# Patient Record
Sex: Female | Born: 1950
Health system: Southern US, Community
[De-identification: ages and names within clinical notes are randomized; demographics above are authoritative.]

## PROBLEM LIST (undated history)

## (undated) DIAGNOSIS — D649 Anemia, unspecified: Secondary | ICD-10-CM

## (undated) DIAGNOSIS — E785 Hyperlipidemia, unspecified: Secondary | ICD-10-CM

## (undated) DIAGNOSIS — M810 Age-related osteoporosis without current pathological fracture: Secondary | ICD-10-CM

## (undated) DIAGNOSIS — K219 Gastro-esophageal reflux disease without esophagitis: Secondary | ICD-10-CM

## (undated) DIAGNOSIS — F329 Major depressive disorder, single episode, unspecified: Secondary | ICD-10-CM

## (undated) DIAGNOSIS — J302 Other seasonal allergic rhinitis: Secondary | ICD-10-CM

## (undated) DIAGNOSIS — K222 Esophageal obstruction: Secondary | ICD-10-CM

## (undated) DIAGNOSIS — K449 Diaphragmatic hernia without obstruction or gangrene: Secondary | ICD-10-CM

## (undated) DIAGNOSIS — M1990 Unspecified osteoarthritis, unspecified site: Secondary | ICD-10-CM

## (undated) DIAGNOSIS — E079 Disorder of thyroid, unspecified: Secondary | ICD-10-CM

## (undated) DIAGNOSIS — K648 Other hemorrhoids: Secondary | ICD-10-CM

## (undated) DIAGNOSIS — F32A Depression, unspecified: Secondary | ICD-10-CM

## (undated) HISTORY — DX: Esophageal obstruction: K22.2

## (undated) HISTORY — DX: Disorder of thyroid, unspecified: E07.9

## (undated) HISTORY — PX: WISDOM TOOTH EXTRACTION: SHX21

## (undated) HISTORY — DX: Other seasonal allergic rhinitis: J30.2

## (undated) HISTORY — DX: Unspecified osteoarthritis, unspecified site: M19.90

## (undated) HISTORY — DX: Gastro-esophageal reflux disease without esophagitis: K21.9

## (undated) HISTORY — DX: Hyperlipidemia, unspecified: E78.5

## (undated) HISTORY — DX: Depression, unspecified: F32.A

## (undated) HISTORY — DX: Other hemorrhoids: K64.8

## (undated) HISTORY — DX: Diaphragmatic hernia without obstruction or gangrene: K44.9

## (undated) HISTORY — DX: Major depressive disorder, single episode, unspecified: F32.9

## (undated) HISTORY — PX: NO PAST SURGERIES: SHX2092

## (undated) HISTORY — DX: Anemia, unspecified: D64.9

## (undated) HISTORY — DX: Age-related osteoporosis without current pathological fracture: M81.0

---

## 1998-11-19 ENCOUNTER — Other Ambulatory Visit: Admission: RE | Admit: 1998-11-19 | Discharge: 1998-11-19 | Payer: Self-pay | Admitting: Obstetrics and Gynecology

## 1999-12-16 ENCOUNTER — Other Ambulatory Visit: Admission: RE | Admit: 1999-12-16 | Discharge: 1999-12-16 | Payer: Self-pay | Admitting: Obstetrics and Gynecology

## 2000-07-27 ENCOUNTER — Encounter: Payer: Self-pay | Admitting: Family Medicine

## 2000-07-27 ENCOUNTER — Ambulatory Visit (HOSPITAL_COMMUNITY): Admission: RE | Admit: 2000-07-27 | Discharge: 2000-07-27 | Payer: Self-pay | Admitting: Family Medicine

## 2000-12-22 ENCOUNTER — Other Ambulatory Visit: Admission: RE | Admit: 2000-12-22 | Discharge: 2000-12-22 | Payer: Self-pay | Admitting: Obstetrics and Gynecology

## 2001-12-28 ENCOUNTER — Other Ambulatory Visit: Admission: RE | Admit: 2001-12-28 | Discharge: 2001-12-28 | Payer: Self-pay | Admitting: Obstetrics and Gynecology

## 2002-02-18 ENCOUNTER — Encounter: Payer: Self-pay | Admitting: *Deleted

## 2002-02-18 ENCOUNTER — Ambulatory Visit (HOSPITAL_COMMUNITY): Admission: RE | Admit: 2002-02-18 | Discharge: 2002-02-18 | Payer: Self-pay | Admitting: *Deleted

## 2002-11-16 ENCOUNTER — Encounter: Admission: RE | Admit: 2002-11-16 | Discharge: 2003-02-14 | Payer: Self-pay

## 2003-01-02 ENCOUNTER — Other Ambulatory Visit: Admission: RE | Admit: 2003-01-02 | Discharge: 2003-01-02 | Payer: Self-pay | Admitting: Obstetrics and Gynecology

## 2009-01-30 ENCOUNTER — Other Ambulatory Visit: Admission: RE | Admit: 2009-01-30 | Discharge: 2009-01-30 | Payer: Self-pay | Admitting: Obstetrics and Gynecology

## 2009-11-15 ENCOUNTER — Ambulatory Visit (HOSPITAL_COMMUNITY): Admission: RE | Admit: 2009-11-15 | Discharge: 2009-11-15 | Payer: Self-pay | Admitting: Family Medicine

## 2010-06-16 HISTORY — PX: COLONOSCOPY: SHX174

## 2010-08-09 ENCOUNTER — Encounter (INDEPENDENT_AMBULATORY_CARE_PROVIDER_SITE_OTHER): Payer: Self-pay | Admitting: *Deleted

## 2010-08-13 NOTE — Letter (Signed)
Summary: Pre Visit Letter Revised  [facility]  [address] [place], [place] [address]   Phone: [phone]  Fax: [phone]        08/09/2010 MRN: [id number] Vanessa Edwards [address] [place], [place]  [address]             Procedure Date:  September 16, 2100   recall col - Dr Juanda Chance  Welcome to the Gastroenterology Division at [facility].    You are scheduled to see a nurse for your pre-procedure visit on September 03, 2010 at 1:30pm on the 3rd floor at [facility], [address]  We ask that you try to arrive at our office 15 minutes prior to your appointment time to allow for check-in.  Please take a minute to review the attached form.  If you answer "Yes" to one or more of the questions on the first page, we ask that you call the person listed at your earliest opportunity.  If you answer "No" to all of the questions, please complete the rest of the form and bring it to your appointment.    Your nurse visit will consist of discussing your medical and surgical history, your immediate family medical history, and your medications.   If you are unable to list all of your medications on the form, please bring the medication bottles to your appointment and we will list them.  We will need to be aware of both prescribed and over the counter drugs.  We will need to know exact dosage information as well.    Please be prepared to read and sign documents such as consent forms, a financial agreement, and acknowledgement forms.  If necessary, and with your consent, a friend or relative is welcome to sit-in on the nurse visit with you.  Please bring your insurance card so that we may make a copy of it.  If your insurance requires a referral to see a specialist, please bring your referral form from your primary care physician.  No co-pay is required for this nurse visit.     If you cannot keep your appointment, please call [phone] to cancel or reschedule prior to  your appointment date.  This allows [place] the opportunity to schedule an appointment for another patient in need of care.    Thank you for choosing [facility] for your medical needs.  We appreciate the opportunity to care for you.  Please visit [place] at our website  to learn more about our practice.  Sincerely, The Gastroenterology Division

## 2010-09-03 ENCOUNTER — Ambulatory Visit (AMBULATORY_SURGERY_CENTER): Payer: BC Managed Care – PPO | Admitting: *Deleted

## 2010-09-03 VITALS — Ht 62.0 in | Wt 152.7 lb

## 2010-09-03 DIAGNOSIS — Z1211 Encounter for screening for malignant neoplasm of colon: Secondary | ICD-10-CM

## 2010-09-03 MED ORDER — PEG-KCL-NACL-NASULF-NA ASC-C 100 G PO SOLR: 1.0000 | Freq: Once | ORAL | Status: AC

## 2010-09-03 NOTE — Patient Instructions (Signed)
Pick up RX for Moviprep within 5 days of today.  

## 2010-09-04 ENCOUNTER — Encounter: Payer: Self-pay | Admitting: Internal Medicine

## 2010-09-16 ENCOUNTER — Encounter: Payer: Self-pay | Admitting: Internal Medicine

## 2010-09-17 ENCOUNTER — Ambulatory Visit (AMBULATORY_SURGERY_CENTER): Payer: BC Managed Care – PPO | Admitting: Internal Medicine

## 2010-09-17 ENCOUNTER — Encounter: Payer: Self-pay | Admitting: Internal Medicine

## 2010-09-17 VITALS — BP 135/76 | HR 83 | Temp 97.2°F | Resp 19 | Ht 61.0 in | Wt 150.0 lb

## 2010-09-17 DIAGNOSIS — Z1211 Encounter for screening for malignant neoplasm of colon: Secondary | ICD-10-CM

## 2010-09-17 DIAGNOSIS — K648 Other hemorrhoids: Secondary | ICD-10-CM

## 2010-09-17 MED ORDER — HYDROCORTISONE ACETATE 25 MG RE SUPP: 25.0000 mg | Freq: Every evening | RECTAL | Status: AC | PRN

## 2010-09-17 NOTE — Patient Instructions (Signed)
Discharge instructions given with verbal understanding. Handout on hemorrhoids given.

## 2010-09-18 ENCOUNTER — Telehealth: Payer: Self-pay | Admitting: *Deleted

## 2010-09-18 NOTE — Telephone Encounter (Signed)

## 2010-12-30 ENCOUNTER — Other Ambulatory Visit (HOSPITAL_COMMUNITY): Payer: Self-pay | Admitting: Family Medicine

## 2010-12-30 DIAGNOSIS — Z1231 Encounter for screening mammogram for malignant neoplasm of breast: Secondary | ICD-10-CM

## 2011-01-07 ENCOUNTER — Ambulatory Visit (HOSPITAL_COMMUNITY): Payer: BC Managed Care – PPO

## 2011-01-15 ENCOUNTER — Ambulatory Visit (HOSPITAL_COMMUNITY): Payer: BC Managed Care – PPO

## 2011-01-17 ENCOUNTER — Ambulatory Visit (HOSPITAL_COMMUNITY)
Admission: RE | Admit: 2011-01-17 | Discharge: 2011-01-17 | Disposition: A | Discharge status: Home / Self Care | Payer: BC Managed Care – PPO | Source: Ambulatory Visit | Attending: Family Medicine | Admitting: Family Medicine

## 2011-01-17 DIAGNOSIS — Z1231 Encounter for screening mammogram for malignant neoplasm of breast: Secondary | ICD-10-CM

## 2011-02-12 ENCOUNTER — Telehealth: Payer: Self-pay | Admitting: Internal Medicine

## 2011-02-12 NOTE — Telephone Encounter (Signed)
Patient has had colonoscopies with Dr. Juanda Chance. She now has bad indigestion and heart burn at night. She saw her PCP and was put on Pepcid AC in AM. This is not helping, she is having difficultly sleeping because of the reflux, heart burn. Patient wants to schedule an EGD. Should she been seen in OV or direct? Please, advise.

## 2011-02-12 NOTE — Telephone Encounter (Signed)
I know her well.She may try OTC Prilosec 20 mg po bid x 3 weeks, if no improvement then OK to schedule direct EGD

## 2011-02-13 NOTE — Telephone Encounter (Signed)
Spoke with patient and gave her Dr. Brodie's recommendations. 

## 2011-02-13 NOTE — Telephone Encounter (Signed)
Left a message for patient to call me. 

## 2011-03-13 ENCOUNTER — Telehealth: Payer: Self-pay | Admitting: Internal Medicine

## 2011-03-13 NOTE — Telephone Encounter (Signed)
Patient given recommendations by Dr. Juanda Chance. She is taking OTC Prilosec.

## 2011-03-13 NOTE — Telephone Encounter (Signed)
Continue bid dose x 1 month, then try to reduce to 1 po qd, if symptoms recur, stay on bid dose. Needs refills?

## 2011-03-13 NOTE — Telephone Encounter (Signed)
Patient is unavailable will call back in a few minutes.

## 2011-03-13 NOTE — Telephone Encounter (Signed)
Left message for patient to call me

## 2011-03-13 NOTE — Telephone Encounter (Signed)
Spoke with patient and she has been taking the Prilosec 20 mg x 3 weeks and she has felt better, no reflux. She missed one dose and felt bad. She wants to know if she should continue on BID and if so for how long. Please,advise.

## 2011-03-18 ENCOUNTER — Other Ambulatory Visit: Payer: Self-pay | Admitting: *Deleted

## 2011-03-18 MED ORDER — OMEPRAZOLE 20 MG PO CPDR: 20.0000 mg | DELAYED_RELEASE_CAPSULE | Freq: Two times a day (BID) | ORAL | Status: DC

## 2011-04-21 ENCOUNTER — Other Ambulatory Visit: Payer: Self-pay | Admitting: Internal Medicine

## 2011-06-27 ENCOUNTER — Other Ambulatory Visit: Payer: Self-pay | Admitting: Internal Medicine

## 2011-10-20 ENCOUNTER — Other Ambulatory Visit: Payer: Self-pay | Admitting: Internal Medicine

## 2011-12-23 ENCOUNTER — Other Ambulatory Visit (HOSPITAL_COMMUNITY): Payer: Self-pay | Admitting: Family Medicine

## 2011-12-23 DIAGNOSIS — Z1231 Encounter for screening mammogram for malignant neoplasm of breast: Secondary | ICD-10-CM

## 2012-01-02 ENCOUNTER — Other Ambulatory Visit: Payer: Self-pay | Admitting: Family Medicine

## 2012-01-02 ENCOUNTER — Other Ambulatory Visit (HOSPITAL_COMMUNITY)
Admission: RE | Admit: 2012-01-02 | Discharge: 2012-01-02 | Disposition: A | Discharge status: Home / Self Care | Payer: BC Managed Care – PPO | Source: Ambulatory Visit | Attending: Family Medicine | Admitting: Family Medicine

## 2012-01-02 DIAGNOSIS — Z0000 Encounter for general adult medical examination without abnormal findings: Secondary | ICD-10-CM | POA: Insufficient documentation

## 2012-01-20 ENCOUNTER — Ambulatory Visit (HOSPITAL_COMMUNITY)
Admission: RE | Admit: 2012-01-20 | Discharge: 2012-01-20 | Disposition: A | Discharge status: Home / Self Care | Payer: BC Managed Care – PPO | Source: Ambulatory Visit | Attending: Family Medicine | Admitting: Family Medicine

## 2012-01-20 DIAGNOSIS — Z1231 Encounter for screening mammogram for malignant neoplasm of breast: Secondary | ICD-10-CM

## 2012-07-14 ENCOUNTER — Other Ambulatory Visit: Payer: Self-pay | Admitting: Internal Medicine

## 2012-07-22 ENCOUNTER — Encounter: Payer: Self-pay | Admitting: Internal Medicine

## 2012-07-22 ENCOUNTER — Other Ambulatory Visit: Payer: Self-pay | Admitting: Internal Medicine

## 2012-07-22 MED ORDER — OMEPRAZOLE 20 MG PO CPDR: 20.0000 mg | DELAYED_RELEASE_CAPSULE | Freq: Two times a day (BID) | ORAL | Status: DC

## 2012-07-22 NOTE — Telephone Encounter (Signed)
rx sent. Must keep office visit for refills.

## 2012-07-26 ENCOUNTER — Encounter: Payer: Self-pay | Admitting: *Deleted

## 2012-09-08 ENCOUNTER — Ambulatory Visit (INDEPENDENT_AMBULATORY_CARE_PROVIDER_SITE_OTHER): Payer: BC Managed Care – PPO | Admitting: Internal Medicine

## 2012-09-08 ENCOUNTER — Encounter: Payer: Self-pay | Admitting: Internal Medicine

## 2012-09-08 VITALS — BP 122/78 | HR 108 | Ht 61.0 in | Wt 152.8 lb

## 2012-09-08 DIAGNOSIS — K219 Gastro-esophageal reflux disease without esophagitis: Secondary | ICD-10-CM

## 2012-09-08 MED ORDER — OMEPRAZOLE 20 MG PO CPDR: 20.0000 mg | DELAYED_RELEASE_CAPSULE | Freq: Every day | ORAL | Status: DC

## 2012-09-08 NOTE — Progress Notes (Signed)
Vanessa Edwards [birth year] MRN [id number]  History of Present Illness:  This is a 62 year old white female with chronic gastroesophageal reflux which was controlled on Prilosec 20 mg a day until she ran out of the medications and developed severe heartburn at night as well as a cough. She denies dysphagia. She has been on Mobic 15 mg daily for DJD. She has also tried Pepcid. A screening colonoscopy in April 2012 showed internal hemorrhoids. She will be due for a recall in 10 years. Patient does not smoke. She has gained weight. She drinks 2 cups of coffee in the morning. She is on Celexa 20 mg daily.   Past Medical History  Diagnosis Date  . Arthritis   . Depression   . GERD (gastroesophageal reflux disease)   . Hyperlipidemia   . Thyroid disease   . Internal hemorrhoids   . Anemia   . Diverticulosis   . IBS (irritable bowel syndrome)    History reviewed. No pertinent past surgical history.  reports that she has never smoked. She does not have any smokeless tobacco history on file. She reports that she drinks about 7.0 ounces of alcohol per week. She reports that she does not use illicit drugs. family history includes Throat cancer in her mother.  There is no history of Colon cancer. Allergies  Allergen Reactions  . Amoxicillin Rash  . Codeine Anxiety  . Nabumetone Rash        Review of Systems: No change in bowel habits. She denies rectal bleeding  The remainder of the 10 point ROS is negative except as outlined in H&P   Physical Exam: General appearance  Well developed, in no distress. Eyes- non icteric. HEENT nontraumatic, normocephalic. Mouth no lesions, tongue papillated, no cheilosis. Neck supple without adenopathy, thyroid not enlarged, no carotid bruits, no JVD. Lungs Clear to auscultation bilaterally. Cor normal S1, normal S2, regular rhythm, no murmur,  quiet precordium. Abdomen: Soft protuberant with normoactive bowel sounds. No tenderness. Liver edge at costal  margin. Rectal: Not done. Extremities no pedal edema. Skin no lesions. Neurological alert and oriented x 3. Psychological normal mood and affect.  Assessment and Plan:  Problem #1 Chronic gastroesophageal reflux exacerbation after patient ran out of Prilosec. We will refill her medication for 1 year. We have discussed antireflux measures including weight loss, head of the bed elevation and avoiding certain foods. She will be scheduled for an upper endoscopy to assess for hiatal hernia or Barrett's esophagus. We have discussed the possibility of Mobic causing gastritis and Celexa increasing reflux. She will continue it for now.   09/08/2012 Lina Sar

## 2012-09-08 NOTE — Patient Instructions (Addendum)
You have been scheduled for an endoscopy with propofol. Please follow written instructions given to you at your visit today. If you use inhalers (even only as needed), please bring them with you on the day of your procedure.  We have sent the following prescriptions to your mail in pharmacy: omeprazole  If you have not heard from your mail in pharmacy within 1 week or if you have not received your medication in the mail, please contact us at [phone] so we may find out why.  Cc: Dr [name]

## 2012-09-24 ENCOUNTER — Encounter: Payer: Self-pay | Admitting: Internal Medicine

## 2012-09-24 ENCOUNTER — Ambulatory Visit (AMBULATORY_SURGERY_CENTER): Payer: BC Managed Care – PPO | Admitting: Internal Medicine

## 2012-09-24 VITALS — BP 134/80 | HR 84 | Temp 98.5°F | Resp 19 | Ht 61.0 in | Wt 152.0 lb

## 2012-09-24 DIAGNOSIS — K2960 Other gastritis without bleeding: Secondary | ICD-10-CM

## 2012-09-24 DIAGNOSIS — K2990 Gastroduodenitis, unspecified, without bleeding: Secondary | ICD-10-CM

## 2012-09-24 DIAGNOSIS — K222 Esophageal obstruction: Secondary | ICD-10-CM

## 2012-09-24 DIAGNOSIS — K219 Gastro-esophageal reflux disease without esophagitis: Secondary | ICD-10-CM

## 2012-09-24 DIAGNOSIS — K2970 Gastritis, unspecified, without bleeding: Secondary | ICD-10-CM

## 2012-09-24 MED ORDER — SODIUM CHLORIDE 0.9 % IV SOLN: 500.0000 mL | INTRAVENOUS | Status: DC

## 2012-09-24 NOTE — Patient Instructions (Addendum)
Discharge instructions given with verbal understanding. Handouts on gastritis and a dilatation diet given. Resume previous medications. YOU HAD AN ENDOSCOPIC PROCEDURE TODAY AT THE [facility]: Refer to the procedure report that was given to you for any specific questions about what was found during the examination.  If the procedure report does not answer your questions, please call your gastroenterologist to clarify.  If you requested that your care partner not be given the details of your procedure findings, then the procedure report has been included in a sealed envelope for you to review at your convenience later.  YOU SHOULD EXPECT: Some feelings of bloating in the abdomen. Passage of more gas than usual.  Walking can help get rid of the air that was put into your GI tract during the procedure and reduce the bloating. If you had a lower endoscopy (such as a colonoscopy or flexible sigmoidoscopy) you may notice spotting of blood in your stool or on the toilet paper. If you underwent a bowel prep for your procedure, then you may not have a normal bowel movement for a few days.  DIET: Your first meal following the procedure should be a light meal and then it is ok to progress to your normal diet.  A half-sandwich or bowl of soup is an example of a good first meal.  Heavy or fried foods are harder to digest and may make you feel nauseous or bloated.  Likewise meals heavy in dairy and vegetables can cause extra gas to form and this can also increase the bloating.  Drink plenty of fluids but you should avoid alcoholic beverages for 24 hours.  ACTIVITY: Your care partner should take you home directly after the procedure.  You should plan to take it easy, moving slowly for the rest of the day.  You can resume normal activity the day after the procedure however you should NOT DRIVE or use heavy machinery for 24 hours (because of the sedation medicines used during the test).    SYMPTOMS TO REPORT  IMMEDIATELY: A gastroenterologist can be reached at any hour.  During normal business hours, 8:30 AM to 5:00 PM Monday through Friday, call [phone].  After hours and on weekends, please call the GI answering service at [phone] who will take a message and have the physician on call contact you.   Following upper endoscopy (EGD)  Vomiting of blood or coffee ground material  New chest pain or pain under the shoulder blades  Painful or persistently difficult swallowing  New shortness of breath  Fever of 100F or higher  Black, tarry-looking stools  FOLLOW UP: If any biopsies were taken you will be contacted by phone or by letter within the next 1-3 weeks.  Call your gastroenterologist if you have not heard about the biopsies in 3 weeks.  Our staff will call the home number listed on your records the next business day following your procedure to check on you and address any questions or concerns that you may have at that time regarding the information given to you following your procedure. This is a courtesy call and so if there is no answer at the home number and we have not heard from you through the emergency physician on call, we will assume that you have returned to your regular daily activities without incident.  SIGNATURES/CONFIDENTIALITY: You and/or your care partner have signed paperwork which will be entered into your electronic medical record.  These signatures attest to the fact that that  the information above on your After Visit Summary has been reviewed and is understood.  Full responsibility of the confidentiality of this discharge information lies with you and/or your care-partner.

## 2012-09-24 NOTE — Progress Notes (Signed)
Patient did not experience any of the following events: a burn prior to discharge; a fall within the facility; wrong site/side/patient/procedure/implant event; or a hospital transfer or hospital admission upon discharge from the facility. (G8907) Patient did not have preoperative order for IV antibiotic SSI prophylaxis. (G8918)  

## 2012-09-24 NOTE — Progress Notes (Signed)
Called to room to assist during endoscopic procedure.  Patient ID and intended procedure confirmed with present staff. Received instructions for my participation in the procedure from the performing physician.  

## 2012-09-24 NOTE — Op Note (Signed)
[facility] [address] [place], [address]   ENDOSCOPY PROCEDURE REPORT  PATIENT: Anndrea, Mihelich  MR#: [id number] BIRTHDATE: [birth year] , 61  yrs. old GENDER: Female ENDOSCOPIST: Hart Carwin, MD REFERRED BY:  Dr [name] PROCEDURE DATE:  09/24/2012 PROCEDURE:  EGD w/ biopsy and Maloney dilation of esophagus ASA CLASS:     Class II INDICATIONS:  Heartburn.   History of esophageal reflux. ,recent flare up, currently controlled on Omeprazole 20 mg qd MEDICATIONS: MAC sedation, administered by CRNA and Propofol (Diprivan) 160 mg IV TOPICAL ANESTHETIC: Cetacaine Spray  DESCRIPTION OF PROCEDURE: After the risks benefits and alternatives of the procedure were thoroughly explained, informed consent was obtained.  The [facility] [id number] endoscope was introduced through the mouth and advanced to the second portion of the duodenum. Without limitations.  The instrument was slowly withdrawn as the mucosa was fully examined.      Endoscope passed through the posterior pharynx into the esophagus without difficulty. Esophageal mucosa and lumen was normal. There was a mild nonobstructing esophageal stricture at the GE junction at 30 cm. It allowed the endoscope to traverse without difficulty. The stricture appeared benign. There was no evidence of acute esophagitis. Stomach: There was a 3 cm partially reducible hiatal hernia, there were several scattered fundic gland polyps. Gastric antrum was erythematous but there were no erosions. Pyloric outlet was normal. Retroflexion of the endoscope showed normal cardia and fundus. Biopsies were taken from the gastric antrum to rule out H. pyloric. Duodenum call him duodenal bulb and descending duodenum was normal. Maloney dilators 48 [place] passed throuigh the esophagus without difficulty. There was no blood on the dilator, patient tolerated the procedure well[         The scope was then withdrawn from the patient and  the procedure completed.  COMPLICATIONS: There were no complications. ENDOSCOPIC IMPRESSION:   mild nonobstructing esophageal stricture. Status post passage of a 77 [place] Maloney dilator 3 cm partially reducible hiatal hernia Mild antral gastritis status post biopsies to rule out NSAID gastropathy  1.  Await pathology results 2.  Continue PPI 3.  Anti-reflux regimen to be follow 4.hold antiinflammatory agents REPEAT EXAM: no recall  eSigned:  Hart Carwin, MD 09/24/2012 12:12 PM   CC:  PATIENT NAME:  Emmali, Karow MR#: [id number]

## 2012-09-27 ENCOUNTER — Telehealth: Payer: Self-pay | Admitting: *Deleted

## 2012-09-27 NOTE — Telephone Encounter (Signed)
  Follow up Call-  Call back number 09/24/2012 09/17/2010  Post procedure Call Back phone  # [phone]  Permission to leave phone message Yes -     Patient questions:  Message left to call us if necessary.

## 2012-09-28 ENCOUNTER — Encounter: Payer: Self-pay | Admitting: Internal Medicine

## 2013-03-04 ENCOUNTER — Other Ambulatory Visit: Payer: Self-pay | Admitting: Internal Medicine

## 2013-06-03 ENCOUNTER — Other Ambulatory Visit: Payer: Self-pay | Admitting: *Deleted

## 2013-06-03 MED ORDER — OMEPRAZOLE 20 MG PO CPDR: 20.0000 mg | DELAYED_RELEASE_CAPSULE | Freq: Every day | ORAL | Status: DC

## 2013-09-12 ENCOUNTER — Other Ambulatory Visit: Payer: Self-pay | Admitting: Internal Medicine

## 2013-11-28 ENCOUNTER — Other Ambulatory Visit: Payer: Self-pay | Admitting: Internal Medicine

## 2014-02-08 ENCOUNTER — Encounter: Payer: Self-pay | Admitting: Internal Medicine

## 2014-02-22 ENCOUNTER — Other Ambulatory Visit: Payer: Self-pay | Admitting: Internal Medicine

## 2014-03-13 ENCOUNTER — Other Ambulatory Visit: Payer: Self-pay | Admitting: Internal Medicine

## 2014-03-13 ENCOUNTER — Encounter: Payer: Self-pay | Admitting: *Deleted

## 2014-04-03 ENCOUNTER — Ambulatory Visit (INDEPENDENT_AMBULATORY_CARE_PROVIDER_SITE_OTHER): Payer: BC Managed Care – PPO | Admitting: Cardiovascular Disease

## 2014-04-03 ENCOUNTER — Encounter: Payer: Self-pay | Admitting: Cardiovascular Disease

## 2014-04-03 VITALS — BP 149/95 | HR 112 | Resp 16 | Ht 61.0 in | Wt 155.4 lb

## 2014-04-03 DIAGNOSIS — R0602 Shortness of breath: Secondary | ICD-10-CM

## 2014-04-03 DIAGNOSIS — E038 Other specified hypothyroidism: Secondary | ICD-10-CM

## 2014-04-03 DIAGNOSIS — R000 Tachycardia, unspecified: Secondary | ICD-10-CM

## 2014-04-03 LAB — TSH: TSH: 0.97 u[IU]/mL (ref 0.350–4.500)

## 2014-04-03 LAB — T3, FREE: T3, Free: 2.8 pg/mL (ref 2.3–4.2)

## 2014-04-03 LAB — T4, FREE: Free T4: 1.09 ng/dL (ref 0.80–1.80)

## 2014-04-03 NOTE — Patient Instructions (Signed)
Your physician has requested that you have an echocardiogram. Echocardiography is a painless test that uses sound waves to create images of your heart. It provides your doctor with information about the size and shape of your heart and how well your heart's chambers and valves are working. This procedure takes approximately one hour. There are no restrictions for this procedure.  Your physician recommends that you return for lab work in: [facility] lab.  Dr. Royann Shiversroitoru recommends that you schedule a follow-up appointment in: as needed.

## 2014-04-04 ENCOUNTER — Encounter: Payer: Self-pay | Admitting: Cardiovascular Disease

## 2014-04-04 NOTE — Progress Notes (Signed)
Patient ID: Vanessa Edwards, female   DOB: September 03, 1950, 63 y.o.   MRN: [id number]      Reason for office visit Tachycardia  Mrs. Vanessa Edwards is referred in consultation for persistent tachycardia. This has been noticed repeatedly on visits with her primary care provider,, Dr. Laurann Montanaynthia White. The patient is only aware of palpitations when she lies her head on the pillow at night. She does not endorse shortness of breath, edema, angina, dizziness, presyncope or syncope. She has some transient right-sided back pain does not exercise related and has spontaneously resolved. She has rare hot flashes.  She was previously seen in the cardiology office back in 2001 her heart rate was normal. Her electrocardiograms at that time showed sinus rhythm. She had nonspecific ST-T changes primarily in lead 3 and a very similar pattern is seen on today's electrocardiogram except that she has sinus tachycardia with a rate of 112 beats per minute. In 2006 she had a normal echocardiogram 2007 she had a normal nuclear stress test.  She has a history of hypothyroidism on appropriate levothyroxine replacement therapy and reports a normal TSH earlier in the year. She has treated hyperlipidemia. There is no personal or family history of arrhythmia, although her mother had recurrent TIAs.  Mrs. Vanessa Edwards exercises 2-3 days a week on a treadmill. She uses a level II an incline at 3.8 mi./h and has been following the exact regimen for years without any change in stamina.   Allergies  Allergen Reactions  . Amoxicillin Rash  . Codeine Anxiety  . Cymbalta [Duloxetine Hcl] Palpitations  . Nabumetone Rash    Current Outpatient Prescriptions  Medication Sig Dispense Refill  . Cholecalciferol (VITAMIN D) 2000 UNITS CAPS Take 2 capsules by mouth daily.      Marland Kitchen. ibuprofen (ADVIL,MOTRIN) 200 MG tablet Take 200 mg by mouth every 8 (eight) hours as needed.      . citalopram (CELEXA) 20 MG tablet Take 10 mg by mouth daily.       .  Levothyroxine Sodium (SYNTHROID PO) Take by mouth. Takes  .0.05mg . daily       . Multiple Vitamin (MULTIVITAMIN) tablet Take 1 tablet by mouth daily.        Marland Kitchen. omeprazole (PRILOSEC) 20 MG capsule TAKE 1 CAPSULE (20 MG TOTAL) BY MOUTH DAILY.  90 capsule  0  . simvastatin (ZOCOR) 40 MG tablet Take 40 mg by mouth every other day.         No current facility-administered medications for this visit.    Past Medical History  Diagnosis Date  . Arthritis   . Depression   . GERD (gastroesophageal reflux disease)   . Hyperlipidemia   . Thyroid disease   . Internal hemorrhoids   . Anemia   . Esophageal stricture   . Hiatal hernia     No past surgical history on file.  Family History  Problem Relation Age of Onset  . Throat cancer Mother   . Colon cancer Neg Hx     History   Social History  . Marital Status: Married    Spouse Name: N/A    Number of Children: N/A  . Years of Education: N/A   Occupational History  . Not on file.   Social History Main Topics  . Smoking status: Never Smoker   . Smokeless tobacco: Never Used  . Alcohol Use: 7.0 oz/week    14 drink(s) per week     Comment: wine  . Drug Use: No  . Sexual  Activity: Not on file   Other Topics Concern  . Not on file   Social History Narrative  . No narrative on file    Review of systems: The patient specifically denies any chest pain at rest or with exertion, dyspnea at rest or with exertion, orthopnea, paroxysmal nocturnal dyspnea, syncope,, focal neurological deficits, intermittent claudication, lower extremity edema, unexplained weight gain, cough, hemoptysis or wheezing.  The patient also denies abdominal pain, nausea, vomiting, dysphagia, diarrhea, constipation, polyuria, polydipsia, dysuria, hematuria, frequency, urgency, abnormal bleeding or bruising, fever, chills, unexpected weight changes, mood swings, change in skin or hair texture, change in voice quality, auditory or visual problems, allergic  reactions or rashes, new musculoskeletal complaints other than usual "aches and pains".   PHYSICAL EXAM BP 149/95  Pulse 112  Resp 16  Ht 5\' 1"  (1.549 m)  Wt 70.489 kg (155 lb 6.4 oz)  BMI 29.38 kg/m2  General: Alert, oriented x3, no distress Head: no evidence of trauma, PERRL, EOMI, no exophtalmos or lid lag, no myxedema, no xanthelasma; normal ears, nose and oropharynx Neck: normal jugular venous pulsations and no hepatojugular reflux; brisk carotid pulses without delay and no carotid bruits Chest: clear to auscultation, no signs of consolidation by percussion or palpation, normal fremitus, symmetrical and full respiratory excursions Cardiovascular: normal position and quality of the apical impulse, regular rhythm, normal first and second heart sounds, no murmurs, rubs or gallops Abdomen: no tenderness or distention, no masses by palpation, no abnormal pulsatility or arterial bruits, normal bowel sounds, no hepatosplenomegaly Extremities: no clubbing, cyanosis or edema; 2+ radial, ulnar and brachial pulses bilaterally; 2+ right femoral, posterior tibial and dorsalis pedis pulses; 2+ left femoral, posterior tibial and dorsalis pedis pulses; no subclavian or femoral bruits Neurological: grossly nonfocal   EKG: Sinus tachycardia, nonspecific ST depression with subtle T-wave inversion in lead 3, less obvious and aVF, very similar to previous tracings   ASSESSMENT AND PLAN  Mrs. Vanessa Edwards has minimally symptomatic sinus tachycardia which is likely to have an extracardiac cause. We'll check an echocardiogram to make sure this is not an unusual presentation for left ventricular dysfunction, but I doubt that this is the case since her functional status is good and unchanged for years. She does not appear pale and does not have overt findings of thyroid toxicosis, but anemia and hyperthyroidism would be good explanations for her tachycardia. I think it's worthwhile repeating a full thyroid profile.  I don't think using a chronotropic negative agent such as a beta blocker is necessary since she is not very symptomatic. I think we should try to find the cause of the tachycardia: Will talk about it some more after we have the results of the and TFTs.  Orders Placed This Encounter  Procedures  . T3, free  . T4, free  . TSH  . EKG 12-Lead  . 2D Echocardiogram without contrast   Meds ordered this encounter  Medications  . ibuprofen (ADVIL,MOTRIN) 200 MG tablet    Sig: Take 200 mg by mouth every 8 (eight) hours as needed.  . Cholecalciferol (VITAMIN D) 2000 UNITS CAPS    Sig: Take 2 capsules by mouth daily.    Junious SilkROITORU,Haden Suder  Arlow Spiers, MD, [facility]ilionFACC [facility] [phone] office [phone] pager

## 2014-04-11 ENCOUNTER — Ambulatory Visit (HOSPITAL_COMMUNITY)
Admission: RE | Admit: 2014-04-11 | Discharge: 2014-04-11 | Disposition: A | Discharge status: Home / Self Care | Payer: BC Managed Care – PPO | Source: Ambulatory Visit | Attending: Cardiology | Admitting: Cardiology

## 2014-04-11 DIAGNOSIS — I472 Ventricular tachycardia: Secondary | ICD-10-CM

## 2014-04-11 DIAGNOSIS — I359 Nonrheumatic aortic valve disorder, unspecified: Secondary | ICD-10-CM | POA: Insufficient documentation

## 2014-04-11 DIAGNOSIS — E785 Hyperlipidemia, unspecified: Secondary | ICD-10-CM | POA: Diagnosis not present

## 2014-04-11 DIAGNOSIS — R0602 Shortness of breath: Secondary | ICD-10-CM

## 2014-04-11 DIAGNOSIS — I4729 Other ventricular tachycardia: Secondary | ICD-10-CM

## 2014-04-11 DIAGNOSIS — I517 Cardiomegaly: Secondary | ICD-10-CM

## 2014-04-11 NOTE — Progress Notes (Signed)
2D Echocardiogram Complete.  04/11/2014   Farrel ConnersBethany Krishna Heuer, RDCS   Preliminary Technician Findings:  There is a small Pericardial Effusion circumferential to the heart, with internal echos present.  Possible Hematoma, although the patient denies any external trauma.  She also denies any chest pain, shortness of breath, or swelling and appears stable throughout exam.  There is no hemodynamic compromise suggesting tamponade physiology.

## 2014-04-14 ENCOUNTER — Encounter: Payer: Self-pay | Admitting: Internal Medicine

## 2014-04-14 ENCOUNTER — Ambulatory Visit (INDEPENDENT_AMBULATORY_CARE_PROVIDER_SITE_OTHER): Payer: BC Managed Care – PPO | Admitting: Internal Medicine

## 2014-04-14 VITALS — BP 114/70 | HR 108 | Ht 61.0 in | Wt 157.2 lb

## 2014-04-14 DIAGNOSIS — K222 Esophageal obstruction: Secondary | ICD-10-CM

## 2014-04-14 DIAGNOSIS — K219 Gastro-esophageal reflux disease without esophagitis: Secondary | ICD-10-CM

## 2014-04-14 MED ORDER — OMEPRAZOLE 20 MG PO CPDR: DELAYED_RELEASE_CAPSULE | ORAL | Status: DC

## 2014-04-14 NOTE — Patient Instructions (Addendum)
Please purchase a probiotic (florastor, align etc) over the counter and take once daily.  We have sent the following medications to your pharmacy for you to pick up at your convenience: Prilosec 20 mg  Dr Laurann Montanaynthia White

## 2014-04-14 NOTE — Progress Notes (Signed)
Vanessa PlumpSusan A Edwards 1950/11/23 [id number]  Note: This dictation was prepared with Dragon digital system. Any transcriptional errors that result from this procedure are unintentional.   History of Present Illness:  This is a [age] year old white female with a history of gastroesophageal reflux well controlled on Prilosec 20 mg every morning. She is due for a refill. She denies dysphagia or heartburn as long as she takes her medication. Her last upper endoscopy in April 2014 showed mild esophageal stricture which was dilated with 48 [place] Maloney dilator. She also has a 3 cm hiatal hernia. Her last screening colonoscopies in both 2002 and 2012 were normal.    Past Medical History  Diagnosis Date  . Arthritis   . Depression   . GERD (gastroesophageal reflux disease)   . Hyperlipidemia   . Thyroid disease   . Internal hemorrhoids   . Anemia   . Esophageal stricture   . Hiatal hernia     History reviewed. No pertinent past surgical history.  Allergies  Allergen Reactions  . Amoxicillin Rash  . Codeine Anxiety  . Cymbalta [Duloxetine Hcl] Palpitations  . Nabumetone Rash    Family history and social history have been reviewed.  Review of Systems: Denies nocturnal cough or hoarseness  The remainder of the 10 point ROS is negative except as outlined in the H&P  Physical Exam: General Appearance Well developed, in no distress Eyes  Non icteric  HEENT  Non traumatic, normocephalic  Mouth No lesion, tongue papillated, no cheilosis Neck Supple without adenopathy, thyroid not enlarged, no carotid bruits, no JVD Lungs Clear to auscultation bilaterally COR Normal S1, normal S2, regular rhythm, no murmur, quiet precordium Abdomen mildly protuberant soft nontender. Rectal not done Extremities  No pedal edema Skin No lesions Neurological Alert and oriented x 3 Psychological Normal mood and affect  Assessment and Plan:   Problem #401 63 year old white female with chronic gastroesophageal  reflux disease under good control with omeprazole 20 mg daily. She will follow antireflux measures. We will refill a 90 day supply of Prilosec with 3 refills. She will return on an as necessary basis.  Problem #2 Chronic constipation. I have advised a high-fiber diet and probiotics.    Lina SarDora Haleema Vanderheyden 04/14/2014

## 2014-06-07 ENCOUNTER — Other Ambulatory Visit: Payer: Self-pay | Admitting: Internal Medicine

## 2014-12-04 ENCOUNTER — Other Ambulatory Visit (HOSPITAL_COMMUNITY): Payer: Self-pay | Admitting: Family Medicine

## 2014-12-04 DIAGNOSIS — Z1231 Encounter for screening mammogram for malignant neoplasm of breast: Secondary | ICD-10-CM

## 2014-12-07 ENCOUNTER — Ambulatory Visit (HOSPITAL_COMMUNITY)
Admission: RE | Admit: 2014-12-07 | Discharge: 2014-12-07 | Disposition: A | Discharge status: Home / Self Care | Payer: Medicare Other | Source: Ambulatory Visit | Attending: Family Medicine | Admitting: Family Medicine

## 2014-12-07 DIAGNOSIS — Z1231 Encounter for screening mammogram for malignant neoplasm of breast: Secondary | ICD-10-CM

## 2015-01-31 ENCOUNTER — Other Ambulatory Visit (HOSPITAL_COMMUNITY)
Admission: RE | Admit: 2015-01-31 | Discharge: 2015-01-31 | Disposition: A | Discharge status: Home / Self Care | Payer: Medicare Other | Source: Ambulatory Visit | Attending: Family Medicine | Admitting: Family Medicine

## 2015-01-31 ENCOUNTER — Other Ambulatory Visit: Payer: Self-pay | Admitting: Family Medicine

## 2015-01-31 DIAGNOSIS — Z124 Encounter for screening for malignant neoplasm of cervix: Secondary | ICD-10-CM | POA: Insufficient documentation

## 2015-02-01 LAB — CYTOLOGY - PAP

## 2015-05-31 ENCOUNTER — Telehealth: Payer: Self-pay

## 2015-05-31 NOTE — Telephone Encounter (Signed)
This is a former Dr Juanda ChanceBrodie Pt.[facility] a request for refills on her Omeprazole 20mg  1 po daily . Pt was contacted and follow up appt was made for 08-08-15 with Dr Lavon PaganiniNandigam . Can we refill RX until follow up? Please advise

## 2015-06-01 MED ORDER — OMEPRAZOLE 20 MG PO CPDR: DELAYED_RELEASE_CAPSULE | ORAL | Status: DC

## 2015-06-01 NOTE — Telephone Encounter (Signed)
Refills sent to [facility]. 2 refills given to supply pt until office visit.

## 2015-06-01 NOTE — Telephone Encounter (Signed)
Ok to refill 

## 2015-07-09 DIAGNOSIS — H524 Presbyopia: Secondary | ICD-10-CM | POA: Diagnosis not present

## 2015-07-09 DIAGNOSIS — H2513 Age-related nuclear cataract, bilateral: Secondary | ICD-10-CM | POA: Diagnosis not present

## 2015-07-09 DIAGNOSIS — H5203 Hypermetropia, bilateral: Secondary | ICD-10-CM | POA: Diagnosis not present

## 2015-08-03 DIAGNOSIS — F419 Anxiety disorder, unspecified: Secondary | ICD-10-CM | POA: Diagnosis not present

## 2015-08-03 DIAGNOSIS — Z791 Long term (current) use of non-steroidal anti-inflammatories (NSAID): Secondary | ICD-10-CM | POA: Diagnosis not present

## 2015-08-03 DIAGNOSIS — M2550 Pain in unspecified joint: Secondary | ICD-10-CM | POA: Diagnosis not present

## 2015-08-03 DIAGNOSIS — R7301 Impaired fasting glucose: Secondary | ICD-10-CM | POA: Diagnosis not present

## 2015-08-03 DIAGNOSIS — E785 Hyperlipidemia, unspecified: Secondary | ICD-10-CM | POA: Diagnosis not present

## 2015-08-08 ENCOUNTER — Encounter: Payer: Self-pay | Admitting: Gastroenterology

## 2015-08-08 ENCOUNTER — Ambulatory Visit (INDEPENDENT_AMBULATORY_CARE_PROVIDER_SITE_OTHER): Payer: PPO | Admitting: Gastroenterology

## 2015-08-08 VITALS — BP 108/66 | HR 88 | Ht 61.0 in | Wt 155.1 lb

## 2015-08-08 DIAGNOSIS — K219 Gastro-esophageal reflux disease without esophagitis: Secondary | ICD-10-CM | POA: Diagnosis not present

## 2015-08-08 MED ORDER — OMEPRAZOLE 20 MG PO CPDR: DELAYED_RELEASE_CAPSULE | ORAL | Status: DC

## 2015-08-08 NOTE — Patient Instructions (Signed)
We will refill your prilosec today Follow up as needed  Your recall colonoscopy is in 09/2020

## 2015-08-08 NOTE — Progress Notes (Signed)
Vanessa Edwards    [id number]    08-19-1950  Primary Care Physician:WHITE,[name], MD  Referring Physician: [name], MD [phone] [address] [place], [place] [address]  Chief complaint:  GERD  HPI:  65 year old white female with a history of gastroesophageal reflux well controlled on Prilosec 20 mg every morning. She is due for a refill. She denies dysphagia or heartburn as long as she takes her medication. Her last upper endoscopy in April 2014 showed mild esophageal stricture which was dilated with 48 [place] Maloney dilator. She also has a 3 cm hiatal hernia. Her last screening colonoscopies in both 2002 and 2012 were normal. Denies any dysphagia, nausea, vomiting, abdominal pain, melena or bright red blood per rectum    Outpatient Encounter Prescriptions as of 08/08/2015  Medication Sig  . Cholecalciferol (VITAMIN D) 2000 UNITS CAPS Take 2 capsules by mouth daily.  . citalopram (CELEXA) 10 MG tablet Take 10 mg by mouth daily.  . Levothyroxine Sodium (SYNTHROID PO) Take by mouth. Takes  .0.05mg . daily   . Magnesium 400 MG TABS Take by mouth.  . meloxicam (MOBIC) 7.5 MG tablet Take 7.5 mg by mouth 2 (two) times daily.  . Multiple Vitamin (MULTIVITAMIN) tablet Take 1 tablet by mouth daily.    Marland Kitchen omeprazole (PRILOSEC) 20 MG capsule TAKE 1 CAPSULE (20 MG TOTAL) BY MOUTH DAILY.  . Probiotic Product (PROBIOTIC DAILY PO) Take by mouth.  . simvastatin (ZOCOR) 40 MG tablet Take 40 mg by mouth every other day.    . [DISCONTINUED] ibuprofen (ADVIL,MOTRIN) 200 MG tablet Take 200 mg by mouth every 8 (eight) hours as needed.   No facility-administered encounter medications on file as of 08/08/2015.    Allergies as of 08/08/2015 - Review Complete 08/08/2015  Allergen Reaction Noted  . Amoxicillin Rash 09/03/2010  . Codeine Anxiety 09/03/2010  . Cymbalta [duloxetine hcl] Palpitations 04/03/2014  . Nabumetone Rash 09/03/2010    Past Medical History  Diagnosis Date   . Arthritis   . Depression   . GERD (gastroesophageal reflux disease)   . Hyperlipidemia   . Thyroid disease   . Internal hemorrhoids   . Anemia   . Esophageal stricture   . Hiatal hernia     History reviewed. No pertinent past surgical history.  Family History  Problem Relation Age of Onset  . Throat cancer Mother   . Colon cancer Neg Hx     Social History   Social History  . Marital Status: Married    Spouse Name: N/A  . Number of Children: N/A  . Years of Education: N/A   Occupational History  . Not on file.   Social History Main Topics  . Smoking status: Never Smoker   . Smokeless tobacco: Never Used  . Alcohol Use: 7.0 oz/week    14 drink(s) per week     Comment: wine  . Drug Use: No  . Sexual Activity: Not on file   Other Topics Concern  . Not on file   Social History Narrative      Review of systems: Review of Systems  Constitutional: Negative for fever and chills.  HENT: Negative.   Eyes: Negative for blurred vision.  Respiratory: Negative for cough, shortness of breath and wheezing.   Cardiovascular: Negative for chest pain and palpitations.  Gastrointestinal: as per HPI Genitourinary: Negative for dysuria, urgency, frequency and hematuria.  Musculoskeletal: Negative for myalgias, back pain and joint pain.  Skin: Negative for itching and rash.  Neurological: Negative for dizziness, tremors, focal weakness, seizures and loss of consciousness.  Endo/Heme/Allergies: Negative for environmental allergies.  Psychiatric/Behavioral: Negative for depression, suicidal ideas and hallucinations.  All other systems reviewed and are negative.   Physical Exam: Filed Vitals:   08/08/15 0921  BP: 108/66  Pulse: 88   Gen:      No acute distress HEENT:  EOMI, sclera anicteric Neck:     No masses; no thyromegaly Lungs:    Clear to auscultation bilaterally; normal respiratory effort CV:         Regular rate and rhythm; no murmurs Abd:      + bowel  sounds; soft, non-tender; no palpable masses, no distension Ext:    No edema; adequate peripheral perfusion Skin:      Warm and dry; no rash Neuro: alert and oriented x 3 Psych: normal mood and affect  Data Reviewed:  As per HPI   Assessment and Plan/Recommendations: 35 yr F with h/o chronic GERD, esophageal stricture s/p dilation in 2014 here for follow up visit Her symptoms are well controlled on PPI daily Discussed anti reflux measure in detail Due for recall colonoscopy in 2022 Return as needed  K. Scherry Ran , MD [phone] Mon-Fri 8a-5p [phone] after 5p, weekends, holidays

## 2015-08-09 DIAGNOSIS — K219 Gastro-esophageal reflux disease without esophagitis: Secondary | ICD-10-CM | POA: Insufficient documentation

## 2015-09-19 DIAGNOSIS — M25561 Pain in right knee: Secondary | ICD-10-CM | POA: Diagnosis not present

## 2015-09-19 DIAGNOSIS — M25571 Pain in right ankle and joints of right foot: Secondary | ICD-10-CM | POA: Diagnosis not present

## 2015-09-19 DIAGNOSIS — M25551 Pain in right hip: Secondary | ICD-10-CM | POA: Diagnosis not present

## 2015-10-17 DIAGNOSIS — M25561 Pain in right knee: Secondary | ICD-10-CM | POA: Diagnosis not present

## 2015-12-06 DIAGNOSIS — M25561 Pain in right knee: Secondary | ICD-10-CM | POA: Diagnosis not present

## 2016-01-07 ENCOUNTER — Encounter: Payer: Self-pay | Admitting: Gastroenterology

## 2016-01-07 ENCOUNTER — Ambulatory Visit (INDEPENDENT_AMBULATORY_CARE_PROVIDER_SITE_OTHER): Payer: PPO | Admitting: Gastroenterology

## 2016-01-07 VITALS — BP 116/78 | HR 76 | Ht 61.0 in | Wt 154.2 lb

## 2016-01-07 DIAGNOSIS — K625 Hemorrhage of anus and rectum: Secondary | ICD-10-CM

## 2016-01-07 DIAGNOSIS — K5900 Constipation, unspecified: Secondary | ICD-10-CM | POA: Diagnosis not present

## 2016-01-07 DIAGNOSIS — K641 Second degree hemorrhoids: Secondary | ICD-10-CM

## 2016-01-07 DIAGNOSIS — K219 Gastro-esophageal reflux disease without esophagitis: Secondary | ICD-10-CM

## 2016-01-07 MED ORDER — LINACLOTIDE 72 MCG PO CAPS: 72.0000 ug | ORAL_CAPSULE | Freq: Every day | ORAL | 11 refills | Status: DC

## 2016-01-07 NOTE — Patient Instructions (Signed)

## 2016-01-07 NOTE — Progress Notes (Signed)
Vanessa Edwards    [id number]    11-May-1951  Primary Care Physician:WHITE,Vanessa S, MD  Referring Physician: [name], MD [phone] [address] [place], [place] [address]  Chief complaint:  Rectal bleeding HPI: 65 year old white female with a history of gastroesophageal reflux here with complaints of intermittent bright red blood per rectum, usually very small volume past few months . She has been constipated and has been straining more in the past few months and has noticed intermittent blood per rectum . Her last colonoscopy in 2012 was normal except for internal hemorrhoids and prior to that she had a normal colonoscopy in 2002 . Patient is interested in undergoing hemorrhoidal band ligation for symptomatic hemorrhoids . GERD symptoms are well controlled on Prilosec 20 mg every morning.  She denies dysphagia or heartburn as long as she takes her medication. Her last upper endoscopy in April 2014 showed mild esophageal stricture which was dilated with 48 [place] Maloney dilator. She also has a 3 cm hiatal hernia.  Outpatient Encounter Prescriptions as of 01/07/2016  Medication Sig  . Cholecalciferol (VITAMIN D) 2000 UNITS CAPS Take 2 capsules by mouth daily.  . citalopram (CELEXA) 10 MG tablet Take 10 mg by mouth daily.  . Levothyroxine Sodium (SYNTHROID PO) Take by mouth. Takes  .0.05mg . daily   . Magnesium 400 MG TABS Take by mouth.  . meloxicam (MOBIC) 7.5 MG tablet Take 7.5 mg by mouth 2 (two) times daily.  . Multiple Vitamin (MULTIVITAMIN) tablet Take 1 tablet by mouth daily.    Marland Kitchen omeprazole (PRILOSEC) 20 MG capsule TAKE 1 CAPSULE (20 MG TOTAL) BY MOUTH DAILY.  . Probiotic Product (PROBIOTIC DAILY PO) Take by mouth.  . simvastatin (ZOCOR) 40 MG tablet Take 40 mg by mouth every other day.     No facility-administered encounter medications on file as of 01/07/2016.     Allergies as of 01/07/2016 - Review Complete 08/08/2015  Allergen Reaction Noted  .  Amoxicillin Rash 09/03/2010  . Codeine Anxiety 09/03/2010  . Cymbalta [duloxetine hcl] Palpitations 04/03/2014  . Nabumetone Rash 09/03/2010    Past Medical History:  Diagnosis Date  . Anemia   . Arthritis   . Depression   . Esophageal stricture   . GERD (gastroesophageal reflux disease)   . Hiatal hernia   . Hyperlipidemia   . Internal hemorrhoids   . Thyroid disease     No past surgical history on file.  Family History  Problem Relation Age of Onset  . Throat cancer Mother   . Colon cancer Neg Hx     Social History   Social History  . Marital status: Married    Spouse name: N/A  . Number of children: N/A  . Years of education: N/A   Occupational History  . Not on file.   Social History Main Topics  . Smoking status: Never Smoker  . Smokeless tobacco: Never Used  . Alcohol use 7.0 oz/week    14 drink(Edwards) per week     Comment: wine  . Drug use: No  . Sexual activity: Not on file   Other Topics Concern  . Not on file   Social History Narrative  . No narrative on file      Review of systems: Review of Systems  Constitutional: Negative for fever and chills.  HENT: Negative.   Eyes: Negative for blurred vision.  Respiratory: Negative for cough, shortness of breath and wheezing.  Cardiovascular: Negative for chest pain and palpitations.  Gastrointestinal: as per HPI Genitourinary: Negative for dysuria, urgency, frequency and hematuria.  Musculoskeletal: Negative for myalgias, back pain and joint pain.  Skin: Negative for itching and rash.  Neurological: Negative for dizziness, tremors, focal weakness, seizures and loss of consciousness.  Endo/Heme/Allergies: Negative for environmental allergies.  Psychiatric/Behavioral: Negative for depression, suicidal ideas and hallucinations.  All other systems reviewed and are negative.   Physical Exam: Vitals:   01/07/16 0851  BP: 116/78  Pulse: 76   Gen:      No acute distress HEENT:  EOMI, sclera  anicteric Neck:     No masses; no thyromegaly Lungs:    Clear to auscultation bilaterally; normal respiratory effort CV:         Regular rate and rhythm; no murmurs Abd:      + bowel sounds; soft, non-tender; no palpable masses, no distension Ext:    No edema; adequate peripheral perfusion Skin:      Warm and dry; no rash Neuro: alert and oriented x 3 Psych: normal mood and affect  Data Reviewed:  Reviewed chart in epic  Assessment and Plan/Recommendations: 65 year old female with history of chronic GERD and internal hemorrhoids presents with intermittent bright red blood per rectum likely secondary to symptomatic grade II  hemorrhoids, patient is requesting rubber band ligation of his/her hemorrhoidal disease.  All risks, benefits and alternative forms of therapy were described and informed consent was obtained.  Colonoscopy in 2002 and 2012 normal except for internal hemorrhoids  In the Left Lateral Decubitus position anoscopic examination revealed grade II hemorrhoids in the right anterior and posterior position(Edwards).  The anorectum was pre-medicated with 0.125% Nitroglycerine The decision was made to band the R posterior internal hemorrhoid, and the [facility] Vanessa Edwards System was used to perform band ligation without complication.  Digital anorectal examination was then performed to assure proper positioning of the band, and to adjust the banded tissue as required.  The patient was discharged home without pain or other issues.  Dietary and behavioral recommendations were given and along with follow-up instructions.     The following adjunctive treatments were recommended:  Increase dietary fiber and fluid intake Benefiber 1 tablespoon 3 times daily with meals Continue PPI and antireflux measures  The patient will return in 2-4 weeks for  follow-up and possible additional banding as required. No complications were encountered and the patient tolerated the procedure well.  25 minutes was  spent face-to-face with the patient. Greater than 50% of the time used for counseling as well as treatment plan and follow-up. She had multiple questions which were answered to her satisfaction   Vanessa Edwards , MD [phone] Mon-Fri 8a-5p [phone] after 5p, weekends, holidays  CC: Vanessa Montana, MD

## 2016-02-14 DIAGNOSIS — F325 Major depressive disorder, single episode, in full remission: Secondary | ICD-10-CM | POA: Diagnosis not present

## 2016-02-14 DIAGNOSIS — Z1211 Encounter for screening for malignant neoplasm of colon: Secondary | ICD-10-CM | POA: Diagnosis not present

## 2016-02-14 DIAGNOSIS — Z23 Encounter for immunization: Secondary | ICD-10-CM | POA: Diagnosis not present

## 2016-02-14 DIAGNOSIS — Z0000 Encounter for general adult medical examination without abnormal findings: Secondary | ICD-10-CM | POA: Diagnosis not present

## 2016-02-14 DIAGNOSIS — J309 Allergic rhinitis, unspecified: Secondary | ICD-10-CM | POA: Diagnosis not present

## 2016-02-14 DIAGNOSIS — E039 Hypothyroidism, unspecified: Secondary | ICD-10-CM | POA: Diagnosis not present

## 2016-02-14 DIAGNOSIS — E785 Hyperlipidemia, unspecified: Secondary | ICD-10-CM | POA: Diagnosis not present

## 2016-02-14 DIAGNOSIS — R7309 Other abnormal glucose: Secondary | ICD-10-CM | POA: Diagnosis not present

## 2016-03-11 ENCOUNTER — Ambulatory Visit (INDEPENDENT_AMBULATORY_CARE_PROVIDER_SITE_OTHER): Payer: PPO | Admitting: Gastroenterology

## 2016-03-11 ENCOUNTER — Encounter: Payer: Self-pay | Admitting: Gastroenterology

## 2016-03-11 VITALS — BP 122/76 | HR 80 | Ht 61.0 in | Wt 154.8 lb

## 2016-03-11 DIAGNOSIS — K625 Hemorrhage of anus and rectum: Secondary | ICD-10-CM | POA: Diagnosis not present

## 2016-03-11 DIAGNOSIS — K642 Third degree hemorrhoids: Secondary | ICD-10-CM

## 2016-03-11 NOTE — Progress Notes (Signed)
PROCEDURE NOTE: The patient presents with symptomatic grade III  hemorrhoids, requesting rubber band ligation of his/her hemorrhoidal disease.  All risks, benefits and alternative forms of therapy were described and informed consent was obtained.   The anorectum was pre-medicated with 0.125% Nitroglycerine The decision was made to band the R anterior internal hemorrhoid, and the [facility] System was used to perform band ligation without complication.  Digital anorectal examination was then performed to assure proper positioning of the band, and to adjust the banded tissue as required.  The patient was discharged home without pain or other issues.  Dietary and behavioral recommendations were given and along with follow-up instructions.     The patient will return in 2-4 weeks for  follow-up and possible additional banding as required. No complications were encountered and the patient tolerated the procedure well.  Iona BeardK. Veena Nandigam , MD [phone] Mon-Fri 8a-5p [phone] after 5p, weekends, holidays

## 2016-03-11 NOTE — Patient Instructions (Signed)

## 2016-03-26 ENCOUNTER — Encounter: Payer: Self-pay | Admitting: Gastroenterology

## 2016-03-26 ENCOUNTER — Ambulatory Visit (INDEPENDENT_AMBULATORY_CARE_PROVIDER_SITE_OTHER): Payer: PPO | Admitting: Gastroenterology

## 2016-03-26 VITALS — BP 110/64 | HR 100 | Ht 61.0 in | Wt 152.5 lb

## 2016-03-26 DIAGNOSIS — K625 Hemorrhage of anus and rectum: Secondary | ICD-10-CM | POA: Diagnosis not present

## 2016-03-26 DIAGNOSIS — K642 Third degree hemorrhoids: Secondary | ICD-10-CM

## 2016-03-26 NOTE — Patient Instructions (Signed)

## 2016-03-26 NOTE — Progress Notes (Signed)
PROCEDURE NOTE: The patient presents with symptomatic grade III  hemorrhoids, requesting rubber band ligation of his/her hemorrhoidal disease.  All risks, benefits and alternative forms of therapy were described and informed consent was obtained.   The anorectum was pre-medicated with 0.125% Nitroglycerine The decision was made to band the Left lateral  internal hemorrhoid, and the [facility] O'Regan System was used to perform band ligation without complication.  Digital anorectal examination was then performed to assure proper positioning of the band, and to adjust the banded tissue as required.  The patient was discharged home without pain or other issues.  Dietary and behavioral recommendations were given and along with follow-up instructions.      The patient will return for  follow-up and possible additional banding as required. No complications were encountered and the patient tolerated the procedure well.  Iona BeardK. Veena Nandigam , MD [phone] Mon-Fri 8a-5p [phone] after 5p, weekends, holidays

## 2016-04-04 DIAGNOSIS — Z79899 Other long term (current) drug therapy: Secondary | ICD-10-CM | POA: Diagnosis not present

## 2016-04-04 DIAGNOSIS — E785 Hyperlipidemia, unspecified: Secondary | ICD-10-CM | POA: Diagnosis not present

## 2016-04-09 DIAGNOSIS — J209 Acute bronchitis, unspecified: Secondary | ICD-10-CM | POA: Diagnosis not present

## 2016-04-10 ENCOUNTER — Telehealth: Payer: Self-pay | Admitting: Gastroenterology

## 2016-04-10 NOTE — Telephone Encounter (Signed)
Banding earlier this month. Did well with minimal spotting at first. Now she is having more bleeding. It's not every bowel movement, but it is more blood than it was. She doesn't have any pain or other symptoms. She denies constipation.

## 2016-04-10 NOTE — Telephone Encounter (Signed)
Please schedule for a visit tomorrow, ok to overbook on my shcedule. Thanks

## 2016-04-11 ENCOUNTER — Ambulatory Visit (INDEPENDENT_AMBULATORY_CARE_PROVIDER_SITE_OTHER): Payer: PPO | Admitting: Gastroenterology

## 2016-04-11 ENCOUNTER — Encounter: Payer: Self-pay | Admitting: Gastroenterology

## 2016-04-11 ENCOUNTER — Encounter (INDEPENDENT_AMBULATORY_CARE_PROVIDER_SITE_OTHER): Payer: Self-pay

## 2016-04-11 VITALS — BP 122/74 | HR 100 | Ht 61.0 in | Wt 155.0 lb

## 2016-04-11 DIAGNOSIS — K625 Hemorrhage of anus and rectum: Secondary | ICD-10-CM

## 2016-04-11 DIAGNOSIS — K641 Second degree hemorrhoids: Secondary | ICD-10-CM | POA: Diagnosis not present

## 2016-04-11 MED ORDER — HYDROCORTISONE ACETATE 25 MG RE SUPP
25.0000 mg | Freq: Two times a day (BID) | RECTAL | 0 refills | Status: DC
Start: 2016-04-11 — End: 2016-11-12

## 2016-04-11 NOTE — Progress Notes (Signed)
Vanessa PlumpSusan A Edwards    [id number]    [birth year]  Primary Care Physician:WHITE,[name], MD  Referring Physician: Laurann Montanaynthia White, MD [phone] [address] [place], [place] [id number]  Chief complaint:  BRBPR HPI:  65 year old white female with symptomatic grade 2-3 hemorrhoids status post-hemorrhoidal band ligation 3 here with complaints of persistent intermittent small volume bright red blood per rectum after a bowel movement. Denies constipation or excessive straining. No itching, burning sensation or prolapse of hemorrhoids.    Outpatient Encounter Prescriptions as of 04/11/2016  Medication Sig  . atorvastatin (LIPITOR) 40 MG tablet Take 1 tablet by mouth daily.  . Cholecalciferol (VITAMIN D) 2000 UNITS CAPS Take 2 capsules by mouth daily.  . citalopram (CELEXA) 10 MG tablet Take 10 mg by mouth daily.  . diclofenac (VOLTAREN) 75 MG EC tablet Take 75 mg by mouth 2 (two) times daily.  . hydrocortisone (ANUSOL-HC) 25 MG suppository Place 1 suppository (25 mg total) rectally 2 (two) times daily.  . Levothyroxine Sodium (SYNTHROID PO) Take 50 mcg by mouth. Takes  .0.05mg . daily   . Magnesium 400 MG TABS Take by mouth.  . Omega-3 Fatty Acids (FISH OIL) 1000 MG CAPS Take by mouth.  Marland Kitchen. omeprazole (PRILOSEC) 20 MG capsule TAKE 1 CAPSULE (20 MG TOTAL) BY MOUTH DAILY.  . simvastatin (ZOCOR) 40 MG tablet Take 40 mg by mouth every other day.     No facility-administered encounter medications on file as of 04/11/2016.     Allergies as of 04/11/2016 - Review Complete 04/11/2016  Allergen Reaction Noted  . Amoxicillin Rash 09/03/2010  . Codeine Anxiety 09/03/2010  . Cymbalta [duloxetine hcl] Palpitations 04/03/2014  . Nabumetone Rash 09/03/2010    Past Medical History:  Diagnosis Date  . Anemia   . Arthritis   . Depression   . Esophageal stricture   . GERD (gastroesophageal reflux disease)   . Hiatal hernia   . Hyperlipidemia   . Internal hemorrhoids   . Thyroid  disease     History reviewed. No pertinent surgical history.  Family History  Problem Relation Age of Onset  . Throat cancer Mother   . Colon cancer Neg Hx     Social History   Social History  . Marital status: Married    Spouse name: N/A  . Number of children: N/A  . Years of education: N/A   Occupational History  . Not on file.   Social History Main Topics  . Smoking status: Never Smoker  . Smokeless tobacco: Never Used  . Alcohol use 7.0 oz/week    14 drink(s) per week     Comment: wine  . Drug use: No  . Sexual activity: Not on file   Other Topics Concern  . Not on file   Social History Narrative  . No narrative on file      Review of systems: Review of Systems  Constitutional: Negative for fever and chills.  HENT: Negative.   Eyes: Negative for blurred vision.  Respiratory: Negative for cough, shortness of breath and wheezing.   Cardiovascular: Negative for chest pain and palpitations.  Gastrointestinal: as per HPI Genitourinary: Negative for dysuria, urgency, frequency and hematuria.  Musculoskeletal: Negative for myalgias, back pain and joint pain.  Skin: Negative for itching and rash.  Neurological: Negative for dizziness, tremors, focal weakness, seizures and loss of consciousness.  Endo/Heme/Allergies: Positive for seasonal allergies.  Psychiatric/Behavioral: Negative for depression, suicidal ideas and hallucinations.  All  other systems reviewed and are negative.   Physical Exam: Vitals:   04/11/16 1427  BP: 122/74  Pulse: 100   Body mass index is 29.29 kg/m. Gen:      No acute distress HEENT:  EOMI, sclera anicteric Neck:     No masses; no thyromegaly Lungs:    Clear to auscultation bilaterally; normal respiratory effort CV:         Regular rate and rhythm; no murmurs Abd:      + bowel sounds; soft, non-tender; no palpable masses, no distension Ext:    No edema; adequate peripheral perfusion Skin:      Warm and dry; no rash Neuro:  alert and oriented x 3 Psych: normal mood and affect  Data Reviewed:  Reviewed labs, radiology imaging, old records and pertinent past GI work up   Assessment and Plan/Recommendations: 65 year old female with history of symptomatic hemorrhoids status post band ligation 3 here with complaints of recurrent small amount of bright red blood per rectum.   The patient is requesting rubber band ligation of his/her hemorrhoidal disease.  All risks, benefits and alternative forms of therapy were described and informed consent was obtained.   The anorectum was pre-medicated with 0.125% Nitroglycerine The decision was made to band the Left lateral internal hemorrhoid, and the [facility] O'Regan System was used to perform band ligation without complication.  Digital anorectal examination was then performed to assure proper positioning of the band, and to adjust the banded tissue as required.  The patient was discharged home without pain or other issues.  Dietary and behavioral recommendations were given and along with follow-up instructions.     The following adjunctive treatments were recommended: Anusol suppository after 7 days as needed at bedtime Benefiber 1 tablespoon TID  The patient will return  for  follow-up and possible additional banding as required. No complications were encountered and the patient tolerated the procedure well.    Iona Beard , MD [phone] Mon-Fri 8a-5p [phone] after 5p, weekends, holidays  CC: [name], MD

## 2016-04-11 NOTE — Telephone Encounter (Signed)
Patient agrees to this plan. She will be here today at 2:15 pm.

## 2016-04-11 NOTE — Patient Instructions (Addendum)
HEMORRHOID BANDING PROCEDURE    FOLLOW-UP CARE   1. The procedure you have had should have been relatively painless since the banding of the area involved does not have nerve endings and there is no pain sensation.  The rubber band cuts off the blood supply to the hemorrhoid and the band may fall off as soon as 48 hours after the banding (the band may occasionally be seen in the toilet bowl following a bowel movement). You may notice a temporary feeling of fullness in the rectum which should respond adequately to plain Tylenol or Motrin.  2. Following the banding, avoid strenuous exercise that evening and resume full activity the next day.  A sitz bath (soaking in a warm tub) or bidet is soothing, and can be useful for cleansing the area after bowel movements.     3. To avoid constipation, take two tablespoons of natural wheat bran, natural oat bran, flax, Benefiber or any over the counter fiber supplement and increase your water intake to 7-8 glasses daily.    4. Unless you have been prescribed anorectal medication, do not put anything inside your rectum for two weeks: No suppositories, enemas, fingers, etc.  5. Occasionally, you may have more bleeding than usual after the banding procedure.  This is often from the untreated hemorrhoids rather than the treated one.  Don't be concerned if there is a tablespoon or so of blood.  If there is more blood than this, lie flat with your bottom higher than your head and apply an ice pack to the area. If the bleeding does not stop within a half an hour or if you feel faint, call our office at [phone] or go to the emergency room.  6. Problems are not common; however, if there is a substantial amount of bleeding, severe pain, chills, fever or difficulty passing urine (very rare) or other problems, you should call us at [phone] or report to the nearest emergency room.  7. Do not stay seated continuously for more than 2-3 hours for a day or two  after the procedure.  Tighten your buttock muscles 10-15 times every two hours and take 10-15 deep breaths every 1-2 hours.  Do not spend more than a few minutes on the toilet if you cannot empty your bowel; instead re-visit the toilet at a later time.   We will send Anusol Suppositories to your pharmacy to use for 7 days, start suppositories 1 week after the banding today  Follow up as needed

## 2016-04-28 ENCOUNTER — Other Ambulatory Visit: Payer: Self-pay | Admitting: Sports Medicine

## 2016-04-28 NOTE — Telephone Encounter (Signed)
Rx refill request

## 2016-07-09 DIAGNOSIS — H524 Presbyopia: Secondary | ICD-10-CM | POA: Diagnosis not present

## 2016-07-09 DIAGNOSIS — H5203 Hypermetropia, bilateral: Secondary | ICD-10-CM | POA: Diagnosis not present

## 2016-07-09 DIAGNOSIS — H2513 Age-related nuclear cataract, bilateral: Secondary | ICD-10-CM | POA: Diagnosis not present

## 2016-07-09 DIAGNOSIS — H04123 Dry eye syndrome of bilateral lacrimal glands: Secondary | ICD-10-CM | POA: Diagnosis not present

## 2016-08-25 ENCOUNTER — Other Ambulatory Visit: Payer: Self-pay | Admitting: Gastroenterology

## 2016-08-31 ENCOUNTER — Other Ambulatory Visit: Payer: Self-pay | Admitting: Sports Medicine

## 2016-10-01 ENCOUNTER — Ambulatory Visit (INDEPENDENT_AMBULATORY_CARE_PROVIDER_SITE_OTHER): Payer: PPO | Admitting: Orthopaedic Surgery

## 2016-10-01 DIAGNOSIS — M25561 Pain in right knee: Secondary | ICD-10-CM

## 2016-10-01 DIAGNOSIS — G8929 Other chronic pain: Secondary | ICD-10-CM

## 2016-10-01 DIAGNOSIS — M25551 Pain in right hip: Secondary | ICD-10-CM | POA: Diagnosis not present

## 2016-10-01 MED ORDER — METHYLPREDNISOLONE ACETATE 40 MG/ML IJ SUSP
40.0000 mg | INTRAMUSCULAR | Status: AC | PRN
  Administered 2016-10-01: 40 mg via INTRA_ARTICULAR

## 2016-10-01 MED ORDER — DICLOFENAC SODIUM 75 MG PO TBEC: DELAYED_RELEASE_TABLET | ORAL | 3 refills | Status: DC

## 2016-10-01 NOTE — Progress Notes (Signed)
Office Visit Note   Patient: Vanessa Edwards           Date of Birth: January 30, 1951           MRN: [id number] Visit Date: 10/01/2016              Requested by: [name], MD [phone] [address] [place], [place] [address] PCP: Cala Bradford, MD   Assessment & Plan: Visit Diagnoses:  1. Chronic pain of right knee   2. Pain of right hip joint     Plan: Per her wishes I prepped her right knee and her right trochanteric area with Betadine and alcohol carotid injections of 3 mL lidocaine and 1 mL of Depo-Medrol in each area which tolerated well. We'll send and sore diclofenac for her. We'll follow up as needed.  Follow-Up Instructions: Return if symptoms worsen or fail to improve.   Orders:  Orders Placed This Encounter  Procedures  . Large Joint Injection/Arthrocentesis  . Large Joint Injection/Arthrocentesis   Meds ordered this encounter  Medications  . diclofenac (VOLTAREN) 75 MG EC tablet    Sig: TAKE ONE TABLET BY MOUTH TWICE A DAY AS NEEDED (office visit before next refill)    Dispense:  60 tablet    Refill:  3    Office visit before next refill      Procedures: Large Joint Inj Date/Time: 10/01/2016 3:11 PM Performed by: Kathryne Hitch Authorized by: Kathryne Hitch   Location:  Knee Site:  R knee Ultrasound Guidance: No   Fluoroscopic Guidance: No   Arthrogram: No   Medications:  40 mg methylPREDNISolone acetate 40 MG/ML Large Joint Inj Date/Time: 10/01/2016 3:11 PM Performed by: Kathryne Hitch Authorized by: Kathryne Hitch   Location:  Hip Site:  R greater trochanter Ultrasound Guidance: No   Fluoroscopic Guidance: No   Arthrogram: No   Medications:  40 mg methylPREDNISolone acetate 40 MG/ML     Clinical Data: No additional findings.   Subjective: No chief complaint on file. The patient is a 66 year old seen before but is been since last year. She comes in with chief complaint of right hip pain and  right knee pain we've injected both of these areas before. We've had her on diclofenac in the past. She would like a refill of diclofenac an injection in both her trochanteric area her right hip and her right knee. She's had no change in her medical status over the last year and overall just points to pain in these areas but no locking catching. It is irritating type of pain to start and affect her mobility.  HPI  Review of Systems She denies any headache, chest pain, shortness of breath, fever, chills, nausea, vomiting.  Objective: Vital Signs: There were no vitals taken for this visit.  Physical Exam She is alert and oriented 3 and in no acute distress Ortho Exam Examination of her right hip shows pain of the trochanteric and somewhat sciatic area she has negative straight leg raise no muscle or nerve dysfunction right lower extremity. She has pain over the IT band some and on the lateral joint line of the knee. Her range motion of her knee is full. Her knee is ligamentously stable. Specialty Comments:  No specialty comments available.  Imaging: No results found.   PMFS History: Patient Active Problem List   Diagnosis Date Noted  . Esophageal reflux 08/09/2015   Past Medical History:  Diagnosis Date  . Anemia   .  Arthritis   . Depression   . Esophageal stricture   . GERD (gastroesophageal reflux disease)   . Hiatal hernia   . Hyperlipidemia   . Internal hemorrhoids   . Thyroid disease     Family History  Problem Relation Age of Onset  . Throat cancer Mother   . Colon cancer Neg Hx     No past surgical history on file. Social History   Occupational History  . Not on file.   Social History Main Topics  . Smoking status: Never Smoker  . Smokeless tobacco: Never Used  . Alcohol use 7.0 oz/week    14 drink(s) per week     Comment: wine  . Drug use: No  . Sexual activity: Not on file

## 2016-11-12 ENCOUNTER — Encounter (INDEPENDENT_AMBULATORY_CARE_PROVIDER_SITE_OTHER): Payer: Self-pay

## 2016-11-12 ENCOUNTER — Ambulatory Visit (INDEPENDENT_AMBULATORY_CARE_PROVIDER_SITE_OTHER): Payer: PPO | Admitting: Gastroenterology

## 2016-11-12 ENCOUNTER — Encounter: Payer: Self-pay | Admitting: Gastroenterology

## 2016-11-12 VITALS — BP 108/62 | HR 90 | Ht 61.0 in | Wt 149.6 lb

## 2016-11-12 DIAGNOSIS — K641 Second degree hemorrhoids: Secondary | ICD-10-CM | POA: Diagnosis not present

## 2016-11-12 DIAGNOSIS — K219 Gastro-esophageal reflux disease without esophagitis: Secondary | ICD-10-CM

## 2016-11-12 DIAGNOSIS — K625 Hemorrhage of anus and rectum: Secondary | ICD-10-CM | POA: Diagnosis not present

## 2016-11-12 NOTE — Progress Notes (Signed)
Vanessa Edwards    [id number]    1950/11/28  Primary Care Physician:White, Aram Beecham, MD  Referring Physician: [name], MD [phone] [address] [place], [place] [address]  Chief complaint:  Hemorrhoids, BRBPR  HPI: 66 year old female with history of chronic GERD, symptomatic hemorrhoids status post hemorrhoidal band ligation X4 here with complaints of intermittent small volume bright red blood per rectum when she wipes after a bowel movement. He was lifting heavy weights recently. Denies constipation or excessive straining. She is feeling some tissue protrude.   Outpatient Encounter Prescriptions as of 11/12/2016  Medication Sig  . atorvastatin (LIPITOR) 40 MG tablet Take 1 tablet by mouth daily.  . Cholecalciferol (VITAMIN D) 2000 UNITS CAPS Take 2 capsules by mouth daily.  . citalopram (CELEXA) 10 MG tablet Take 10 mg by mouth daily.  . diclofenac (VOLTAREN) 75 MG EC tablet TAKE ONE TABLET BY MOUTH TWICE A DAY AS NEEDED (office visit before next refill)  . Levothyroxine Sodium (SYNTHROID PO) Take 50 mcg by mouth. Takes  .0.05mg . daily   . Omega-3 Fatty Acids (FISH OIL) 1000 MG CAPS Take by mouth.  Marland Kitchen omeprazole (PRILOSEC) 20 MG capsule TAKE 1 CAPSULE (20 MG TOTAL) BY MOUTH DAILY.  Marland Kitchen Wheat Dextrin (BENEFIBER DRINK MIX PO) Take by mouth daily.  . [DISCONTINUED] hydrocortisone (ANUSOL-HC) 25 MG suppository Place 1 suppository (25 mg total) rectally 2 (two) times daily.  . [DISCONTINUED] Magnesium 400 MG TABS Take by mouth.  . [DISCONTINUED] simvastatin (ZOCOR) 40 MG tablet Take 40 mg by mouth every other day.     No facility-administered encounter medications on file as of 11/12/2016.     Allergies as of 11/12/2016 - Review Complete 11/12/2016  Allergen Reaction Noted  . Amoxicillin Rash 09/03/2010  . Codeine Anxiety 09/03/2010  . Cymbalta [duloxetine hcl] Palpitations 04/03/2014  . Nabumetone Rash 09/03/2010    Past Medical History:  Diagnosis Date  .  Anemia   . Arthritis   . Depression   . Esophageal stricture   . GERD (gastroesophageal reflux disease)   . Hiatal hernia   . Hyperlipidemia   . Internal hemorrhoids   . Thyroid disease     History reviewed. No pertinent surgical history.  Family History  Problem Relation Age of Onset  . Throat cancer Mother   . Colon cancer Neg Hx   . Stomach cancer Neg Hx   . Esophageal cancer Neg Hx     Social History   Social History  . Marital status: Married    Spouse name: N/A  . Number of children: N/A  . Years of education: N/A   Occupational History  . Not on file.   Social History Main Topics  . Smoking status: Never Smoker  . Smokeless tobacco: Never Used  . Alcohol use 7.0 oz/week    14 drink(s) per week     Comment: wine  . Drug use: No  . Sexual activity: Not on file   Other Topics Concern  . Not on file   Social History Narrative  . No narrative on file      Review of systems: Review of Systems  Constitutional: Negative for fever and chills.  HENT: Negative.   Eyes: Negative for blurred vision.  Respiratory: Negative for cough, shortness of breath and wheezing.   Cardiovascular: Negative for chest pain and palpitations.  Gastrointestinal: as per HPI Genitourinary: Negative for dysuria, urgency, frequency and hematuria.  Musculoskeletal: Negative for  myalgias, back pain and joint pain.  Skin: Negative for itching and rash.  Neurological: Negative for dizziness, tremors, focal weakness, seizures and loss of consciousness.  Endo/Heme/Allergies: Positive for seasonal allergies.  Psychiatric/Behavioral: Negative for depression, suicidal ideas and hallucinations.  All other systems reviewed and are negative.   Physical Exam: Vitals:   11/12/16 0954  BP: 108/62  Pulse: 90   Body mass index is 28.27 kg/m. Gen:      No acute distress HEENT:  EOMI, sclera anicteric Neck:     No masses; no thyromegaly Lungs:    Clear to auscultation bilaterally;  normal respiratory effort CV:         Regular rate and rhythm; no murmurs Abd:      + bowel sounds; soft, non-tender; no palpable masses, no distension Ext:    No edema; adequate peripheral perfusion Skin:      Warm and dry; no rash Neuro: alert and oriented x 3 Psych: normal mood and affect  Data Reviewed:  Reviewed labs, radiology imaging, old records and pertinent past GI work up   Assessment and Plan/Recommendations: 66 year old female presents with symptomatic grade II  hemorrhoids, requesting rubber band ligation of his/her hemorrhoidal disease.  All risks, benefits and alternative forms of therapy were described and informed consent was obtained.  In the Left Lateral Decubitus position anoscopic examination revealed grade 2 hemorrhoids in the right posterior and left lateral position(s).  The anorectum was pre-medicated with 0.125% nitroglycerin and Recticare The decision was made to band the left lateral internal hemorrhoid, and the [facility] O'Regan System was used to perform band ligation without complication.  Digital anorectal examination was then performed to assure proper positioning of the band, and to adjust the banded tissue as required.  The patient was discharged home without pain or other issues.  Dietary and behavioral recommendations were given and along with follow-up instructions.    The patient will return for  follow-up and possible additional banding as required. No complications were encountered and the patient tolerated the procedure well.  GERD: Symptoms stable Continue PPI Antireflux measures  Return as needed   K. Scherry RanVeena Nandigam , MD [phone] Mon-Fri 8a-5p [phone] after 5p, weekends, holidays  CC: Laurann MontanaWhite, Cynthia, MD

## 2016-11-12 NOTE — Patient Instructions (Signed)

## 2016-12-11 ENCOUNTER — Ambulatory Visit (INDEPENDENT_AMBULATORY_CARE_PROVIDER_SITE_OTHER): Payer: PPO | Admitting: Gastroenterology

## 2016-12-11 ENCOUNTER — Encounter: Payer: Self-pay | Admitting: Gastroenterology

## 2016-12-11 VITALS — BP 114/68 | HR 101 | Ht 61.0 in | Wt 149.0 lb

## 2016-12-11 DIAGNOSIS — K5900 Constipation, unspecified: Secondary | ICD-10-CM

## 2016-12-11 DIAGNOSIS — K649 Unspecified hemorrhoids: Secondary | ICD-10-CM

## 2016-12-11 DIAGNOSIS — K219 Gastro-esophageal reflux disease without esophagitis: Secondary | ICD-10-CM | POA: Diagnosis not present

## 2016-12-11 MED ORDER — POLYETHYLENE GLYCOL 3350 17 GM/SCOOP PO POWD: ORAL | 3 refills | Status: DC

## 2016-12-11 NOTE — Progress Notes (Signed)
Vanessa Edwards    [id number]    March 25, 1951  Primary Care Physician:White, Aram Beecham, MD  Referring Physician: [name], MD [phone] [place], [place] [address]  Chief complaint:  Constipation, hemorrhoids, GERD HPI:  66 year old female with history of chronic GERD, constipation and symptomatic internal hemorrhoids status post hemorrhoidal band ligation here for follow-up visit EGD April 2014 with mild esophageal stricture status post dilation with [address] and had a 3 cm hiatal hernia. Colonoscopy 2012 with internal hemorrhoids otherwise normal exam and prior to that she had an exam in 2002.  Patient reports doing well overall after hemorrhoidal band ligation and has not had any further episodes of bright red blood per rectum or protrusion of hemorrhoids.  She continues to have intermittent constipation especially when she travels. She is taking Benefiber as needed. Heartburn and reflux symptoms well controlled with PPI daily. Denies any dysphagia nausea, vomiting, abdominal pain, melena or bright red blood per rectum    Outpatient Encounter Prescriptions as of 12/11/2016  Medication Sig  . atorvastatin (LIPITOR) 40 MG tablet Take 1 tablet by mouth daily.  . Cholecalciferol (VITAMIN D) 2000 UNITS CAPS Take 2 capsules by mouth daily.  . citalopram (CELEXA) 10 MG tablet Take 10 mg by mouth daily.  . diclofenac (VOLTAREN) 75 MG EC tablet TAKE ONE TABLET BY MOUTH TWICE A DAY AS NEEDED (office visit before next refill)  . Levothyroxine Sodium (SYNTHROID PO) Take 50 mcg by mouth. Takes  .0.05mg . daily   . Omega-3 Fatty Acids (FISH OIL) 1000 MG CAPS Take by mouth.  Marland Kitchen omeprazole (PRILOSEC) 20 MG capsule TAKE 1 CAPSULE (20 MG TOTAL) BY MOUTH DAILY.  Marland Kitchen Wheat Dextrin (BENEFIBER DRINK MIX PO) Take by mouth daily.   No facility-administered encounter medications on file as of 12/11/2016.     Allergies as of 12/11/2016 - Review Complete 12/11/2016    Allergen Reaction Noted  . Amoxicillin Rash 09/03/2010  . Codeine Anxiety 09/03/2010  . Cymbalta [duloxetine hcl] Palpitations 04/03/2014  . Nabumetone Rash 09/03/2010    Past Medical History:  Diagnosis Date  . Anemia   . Arthritis   . Depression   . Esophageal stricture   . GERD (gastroesophageal reflux disease)   . Hiatal hernia   . Hyperlipidemia   . Internal hemorrhoids   . Thyroid disease     Past Surgical History:  Procedure Laterality Date  . NO PAST SURGERIES      Family History  Problem Relation Age of Onset  . Throat cancer Mother   . Colon cancer Neg Hx   . Stomach cancer Neg Hx   . Esophageal cancer Neg Hx     Social History   Social History  . Marital status: Married    Spouse name: N/A  . Number of children: N/A  . Years of education: N/A   Occupational History  . Not on file.   Social History Main Topics  . Smoking status: Never Smoker  . Smokeless tobacco: Never Used  . Alcohol use 7.0 oz/week    14 Standard drinks or equivalent per week     Comment: wine  . Drug use: No  . Sexual activity: Not Currently    Partners: Male   Other Topics Concern  . Not on file   Social History Narrative  . No narrative on file      Review of systems: Review of Systems  Constitutional: Negative for  fever and chills.  HENT: Negative.   Eyes: Negative for blurred vision.  Respiratory: Negative for cough, shortness of breath and wheezing.   Cardiovascular: Negative for chest pain and palpitations.  Gastrointestinal: as per HPI Genitourinary: Negative for dysuria, urgency, frequency and hematuria.  Musculoskeletal: Positive for myalgias, back pain and joint pain.  Skin: Negative for itching and rash.  Neurological: Negative for dizziness, tremors, focal weakness, seizures and loss of consciousness.  Endo/Heme/Allergies: Positive for seasonal allergies.  Psychiatric/Behavioral: Negative for depression, suicidal ideas and hallucinations.  All  other systems reviewed and are negative.   Physical Exam: Vitals:   12/11/16 0928  BP: 114/68  Pulse: (!) 101   Body mass index is 28.15 kg/m. Gen:      No acute distress HEENT:  EOMI, sclera anicteric Neck:     No masses; no thyromegaly Lungs:    Clear to auscultation bilaterally; normal respiratory effort CV:         Regular rate and rhythm; no murmurs Abd:      + bowel sounds; soft, non-tender; no palpable masses, no distension Ext:    No edema; adequate peripheral perfusion Skin:      Warm and dry; no rash Neuro: alert and oriented x 3 Psych: normal mood and affect  Data Reviewed:  Reviewed labs, radiology imaging, old records and pertinent past GI work up   Assessment and Plan/Recommendations:  66 year old with history of chronic GERD, esophageal stricture status post dilation in 2014, chronic constipation and symptomatic internal hemorrhoids status post-band ligation  GERD: Stable symptoms Continue omeprazole 20 mg daily, 30 minutes before breakfast Discussed antireflux measures  Constipation: Increase dietary fiber and fluid intake Continue Benefiber 3 times daily with meals 1 teaspoon MiraLAX half capful at bedtime as needed  Symptomatic internal hemorrhoids status post-band ligation: Denies any recurrent symptoms Advised patient to avoid excessive straining and constipation   15 minutes was spent face-to-face with the patient. Greater than 50% of the time used for counseling as well as treatment plan and follow-up. She had multiple questions which were answered to her satisfaction  K. Scherry RanVeena Royalti Schauf , MD [phone] Mon-Fri 8a-5p [phone] after 5p, weekends, holidays  CC: Laurann MontanaWhite, Cynthia, MD

## 2016-12-11 NOTE — Patient Instructions (Addendum)
We have sent the following medications to your pharmacy for you to pick up at your convenience:  Miralax  Take over the counter Benefiber 1 tablespoon three times a day, as needed  Continue taking your Prilosec

## 2017-01-12 ENCOUNTER — Other Ambulatory Visit: Payer: Self-pay | Admitting: Family Medicine

## 2017-01-12 DIAGNOSIS — Z1231 Encounter for screening mammogram for malignant neoplasm of breast: Secondary | ICD-10-CM

## 2017-01-19 ENCOUNTER — Ambulatory Visit
Admission: RE | Admit: 2017-01-19 | Discharge: 2017-01-19 | Disposition: A | Discharge status: Home / Self Care | Payer: PPO | Source: Ambulatory Visit | Attending: Family Medicine | Admitting: Family Medicine

## 2017-01-19 DIAGNOSIS — Z1231 Encounter for screening mammogram for malignant neoplasm of breast: Secondary | ICD-10-CM | POA: Diagnosis not present

## 2017-02-17 DIAGNOSIS — K649 Unspecified hemorrhoids: Secondary | ICD-10-CM | POA: Diagnosis not present

## 2017-02-17 DIAGNOSIS — Z23 Encounter for immunization: Secondary | ICD-10-CM | POA: Diagnosis not present

## 2017-02-17 DIAGNOSIS — R7309 Other abnormal glucose: Secondary | ICD-10-CM | POA: Diagnosis not present

## 2017-02-17 DIAGNOSIS — E039 Hypothyroidism, unspecified: Secondary | ICD-10-CM | POA: Diagnosis not present

## 2017-02-17 DIAGNOSIS — F325 Major depressive disorder, single episode, in full remission: Secondary | ICD-10-CM | POA: Diagnosis not present

## 2017-02-17 DIAGNOSIS — Z0000 Encounter for general adult medical examination without abnormal findings: Secondary | ICD-10-CM | POA: Diagnosis not present

## 2017-02-17 DIAGNOSIS — K219 Gastro-esophageal reflux disease without esophagitis: Secondary | ICD-10-CM | POA: Diagnosis not present

## 2017-02-17 DIAGNOSIS — E785 Hyperlipidemia, unspecified: Secondary | ICD-10-CM | POA: Diagnosis not present

## 2017-05-21 ENCOUNTER — Other Ambulatory Visit: Payer: Self-pay | Admitting: Gastroenterology

## 2017-06-07 ENCOUNTER — Other Ambulatory Visit (INDEPENDENT_AMBULATORY_CARE_PROVIDER_SITE_OTHER): Payer: Self-pay | Admitting: Orthopaedic Surgery

## 2017-06-10 NOTE — Telephone Encounter (Signed)
Ok for refill? 

## 2017-09-28 DIAGNOSIS — M79644 Pain in right finger(s): Secondary | ICD-10-CM | POA: Insufficient documentation

## 2017-09-28 DIAGNOSIS — M71349 Other bursal cyst, unspecified hand: Secondary | ICD-10-CM | POA: Diagnosis not present

## 2017-11-13 ENCOUNTER — Other Ambulatory Visit: Payer: Self-pay | Admitting: Gastroenterology

## 2017-11-16 DIAGNOSIS — M79644 Pain in right finger(s): Secondary | ICD-10-CM | POA: Diagnosis not present

## 2017-11-16 DIAGNOSIS — M67441 Ganglion, right hand: Secondary | ICD-10-CM | POA: Diagnosis not present

## 2017-11-16 DIAGNOSIS — M24041 Loose body in right finger joint(s): Secondary | ICD-10-CM | POA: Diagnosis not present

## 2017-11-22 DIAGNOSIS — B349 Viral infection, unspecified: Secondary | ICD-10-CM | POA: Diagnosis not present

## 2017-11-22 DIAGNOSIS — L270 Generalized skin eruption due to drugs and medicaments taken internally: Secondary | ICD-10-CM | POA: Diagnosis not present

## 2017-11-23 DIAGNOSIS — M67441 Ganglion, right hand: Secondary | ICD-10-CM | POA: Insufficient documentation

## 2017-12-10 ENCOUNTER — Other Ambulatory Visit (INDEPENDENT_AMBULATORY_CARE_PROVIDER_SITE_OTHER): Payer: Self-pay | Admitting: Orthopaedic Surgery

## 2018-02-07 ENCOUNTER — Other Ambulatory Visit: Payer: Self-pay | Admitting: Gastroenterology

## 2018-02-18 DIAGNOSIS — E039 Hypothyroidism, unspecified: Secondary | ICD-10-CM | POA: Diagnosis not present

## 2018-02-18 DIAGNOSIS — Z79899 Other long term (current) drug therapy: Secondary | ICD-10-CM | POA: Diagnosis not present

## 2018-02-18 DIAGNOSIS — Z0000 Encounter for general adult medical examination without abnormal findings: Secondary | ICD-10-CM | POA: Diagnosis not present

## 2018-02-18 DIAGNOSIS — K219 Gastro-esophageal reflux disease without esophagitis: Secondary | ICD-10-CM | POA: Diagnosis not present

## 2018-02-18 DIAGNOSIS — M8588 Other specified disorders of bone density and structure, other site: Secondary | ICD-10-CM | POA: Diagnosis not present

## 2018-02-18 DIAGNOSIS — F419 Anxiety disorder, unspecified: Secondary | ICD-10-CM | POA: Diagnosis not present

## 2018-02-18 DIAGNOSIS — Z23 Encounter for immunization: Secondary | ICD-10-CM | POA: Diagnosis not present

## 2018-02-18 DIAGNOSIS — E559 Vitamin D deficiency, unspecified: Secondary | ICD-10-CM | POA: Diagnosis not present

## 2018-02-18 DIAGNOSIS — E785 Hyperlipidemia, unspecified: Secondary | ICD-10-CM | POA: Diagnosis not present

## 2018-02-18 DIAGNOSIS — R7303 Prediabetes: Secondary | ICD-10-CM | POA: Diagnosis not present

## 2018-02-18 DIAGNOSIS — J3089 Other allergic rhinitis: Secondary | ICD-10-CM | POA: Diagnosis not present

## 2018-02-18 DIAGNOSIS — F325 Major depressive disorder, single episode, in full remission: Secondary | ICD-10-CM | POA: Diagnosis not present

## 2018-03-25 DIAGNOSIS — M8588 Other specified disorders of bone density and structure, other site: Secondary | ICD-10-CM | POA: Diagnosis not present

## 2018-04-21 ENCOUNTER — Other Ambulatory Visit (INDEPENDENT_AMBULATORY_CARE_PROVIDER_SITE_OTHER): Payer: Self-pay | Admitting: Orthopaedic Surgery

## 2018-06-16 DIAGNOSIS — H269 Unspecified cataract: Secondary | ICD-10-CM

## 2018-06-16 HISTORY — DX: Unspecified cataract: H26.9

## 2018-06-16 HISTORY — PX: CATARACT EXTRACTION, BILATERAL: SHX1313

## 2018-06-21 ENCOUNTER — Other Ambulatory Visit (INDEPENDENT_AMBULATORY_CARE_PROVIDER_SITE_OTHER): Payer: Self-pay | Admitting: Orthopaedic Surgery

## 2018-06-21 NOTE — Telephone Encounter (Signed)
Ok for refill? 

## 2018-07-27 DIAGNOSIS — L438 Other lichen planus: Secondary | ICD-10-CM | POA: Diagnosis not present

## 2018-08-24 ENCOUNTER — Other Ambulatory Visit (INDEPENDENT_AMBULATORY_CARE_PROVIDER_SITE_OTHER): Payer: Self-pay | Admitting: Orthopaedic Surgery

## 2018-10-21 ENCOUNTER — Other Ambulatory Visit (INDEPENDENT_AMBULATORY_CARE_PROVIDER_SITE_OTHER): Payer: Self-pay | Admitting: Orthopaedic Surgery

## 2018-10-21 NOTE — Telephone Encounter (Signed)
Ok to rf? 

## 2018-10-27 ENCOUNTER — Ambulatory Visit: Payer: PPO | Admitting: Orthopaedic Surgery

## 2018-11-10 ENCOUNTER — Ambulatory Visit: Payer: PPO | Admitting: Orthopaedic Surgery

## 2018-11-10 ENCOUNTER — Other Ambulatory Visit: Payer: Self-pay

## 2018-11-10 DIAGNOSIS — M7061 Trochanteric bursitis, right hip: Secondary | ICD-10-CM | POA: Diagnosis not present

## 2018-11-10 MED ORDER — METHYLPREDNISOLONE ACETATE 40 MG/ML IJ SUSP
40.0000 mg | INTRAMUSCULAR | Status: AC | PRN
  Administered 2018-11-10: 40 mg via INTRA_ARTICULAR

## 2018-11-10 MED ORDER — LIDOCAINE HCL 1 % IJ SOLN
3.0000 mL | INTRAMUSCULAR | Status: AC | PRN
  Administered 2018-11-10: 3 mL

## 2018-11-10 NOTE — Progress Notes (Signed)
Office Visit Note   Patient: Vanessa Edwards           Date of Birth: January 26, 1951           MRN: [id number] Visit Date: 11/10/2018              Requested by: Laurann MontanaWhite, Cynthia, MD [phone] [place], [place] [id number] PCP: Laurann MontanaWhite, Cynthia, MD   Assessment & Plan: Visit Diagnoses:  1. Trochanteric bursitis, right hip     Plan: Per her wishes I did provide a steroid injection in her right hip trochanteric area today.  We did discuss the risk and benefits of injections.  All question concerns were answered and addressed.  Follow-up can be as needed.  If she continues to have any persistent problems we will see her back.  I would x-ray her at that visit if needed.  Follow-Up Instructions: Return if symptoms worsen or fail to improve.   Orders:  Orders Placed This Encounter  Procedures  . Large Joint Inj   No orders of the defined types were placed in this encounter.     Procedures: Large Joint Inj: R greater trochanter on 11/10/2018 10:43 AM Indications: pain and diagnostic evaluation Details: 22 G 1.5 in needle, lateral approach  Arthrogram: No  Medications: 3 mL lidocaine 1 %; 40 mg methylPREDNISolone acetate 40 MG/ML Outcome: tolerated well, no immediate complications Procedure, treatment alternatives, risks and benefits explained, specific risks discussed. Consent was given by the patient. Immediately prior to procedure a time out was called to verify the correct patient, procedure, equipment, support staff and site/side marked as required. Patient was prepped and draped in the usual sterile fashion.       Clinical Data: No additional findings.   Subjective: Chief Complaint  Patient presents with  . Right Knee - Pain  . Right Hip - Pain  . Right Foot - Pain  The patient is well-known to me.  I have not seen her since 2018 and at that time it was for right hip pain which was trochanteric bursitis and right knee pain which we injected her knee.  She had  done well for a while but then about 3 weeks ago she developed right hip right knee and right foot pain with no known injury.  She says probably is not been bothering her as much the last few days and she made her appointment which she would still like to be seen today and even consider a right hip trochanteric injection.  She has had no other acute changes in her medical status.  We have not x-rayed her hip knee or foot in a long period of time but she does not want x-rays today.  She is not a diabetic and not a smoker.  She points to the trochanteric area of her right hip is the main source of her pain.  She says her right knee and right foot are not hurting her today like they were when she scheduled appointment.  HPI  Review of Systems She currently denies any headache, chest pain, shortness of breath, fever, chills, nausea, vomiting  Objective: Vital Signs: There were no vitals taken for this visit.  Physical Exam She is alert and orient x3 and in no acute distress Ortho Exam Examination of her right hip shows just some slight stiffness with internal and external rotation but no blocks to rotation.  Her pain is mainly just to palpation of the trochanteric area.  Her right knee  and right foot exam showed no acute findings today. Specialty Comments:  No specialty comments available.  Imaging: No results found.   PMFS History: Patient Active Problem List   Diagnosis Date Noted  . Esophageal reflux 08/09/2015   Past Medical History:  Diagnosis Date  . Anemia   . Arthritis   . Depression   . Esophageal stricture   . GERD (gastroesophageal reflux disease)   . Hiatal hernia   . Hyperlipidemia   . Internal hemorrhoids   . Thyroid disease     Family History  Problem Relation Age of Onset  . Throat cancer Mother   . Colon cancer Neg Hx   . Stomach cancer Neg Hx   . Esophageal cancer Neg Hx     Past Surgical History:  Procedure Laterality Date  . NO PAST SURGERIES      Social History   Occupational History  . Not on file  Tobacco Use  . Smoking status: Never Smoker  . Smokeless tobacco: Never Used  Substance and Sexual Activity  . Alcohol use: Yes    Alcohol/week: 14.0 standard drinks    Types: 14 Standard drinks or equivalent per week    Comment: wine  . Drug use: No  . Sexual activity: Not Currently    Partners: Male

## 2018-11-12 ENCOUNTER — Other Ambulatory Visit: Payer: Self-pay | Admitting: Family Medicine

## 2018-11-12 DIAGNOSIS — Z1231 Encounter for screening mammogram for malignant neoplasm of breast: Secondary | ICD-10-CM

## 2018-11-13 ENCOUNTER — Telehealth: Payer: Self-pay

## 2018-11-13 NOTE — Telephone Encounter (Signed)
Pt asymptomatic following potential covid exposure, declines covid testing, number provided for [facility] if patient decides to get tested at later time.

## 2018-11-14 ENCOUNTER — Other Ambulatory Visit: Payer: Self-pay | Admitting: *Deleted

## 2018-11-14 ENCOUNTER — Other Ambulatory Visit: Payer: PPO

## 2018-11-14 DIAGNOSIS — Z20822 Contact with and (suspected) exposure to covid-19: Secondary | ICD-10-CM

## 2018-11-14 NOTE — Telephone Encounter (Signed)
Pt returned call and pt advised on monitoring symptoms 14 days after exposure, quarantining and home isolation if symptoms develop. Pt requesting testing at this time. Pt will be scheduled on 11/14/18 at 3:15 pm at the [facility] site. Pt advised to remain in car and to wear a mask at appt time. Understanding verbalized.

## 2018-11-15 LAB — NOVEL CORONAVIRUS, NAA: SARS-CoV-2, NAA: NOT DETECTED

## 2018-12-10 ENCOUNTER — Other Ambulatory Visit (INDEPENDENT_AMBULATORY_CARE_PROVIDER_SITE_OTHER): Payer: Self-pay | Admitting: Orthopaedic Surgery

## 2018-12-29 ENCOUNTER — Ambulatory Visit
Admission: RE | Admit: 2018-12-29 | Discharge: 2018-12-29 | Disposition: A | Discharge status: Home / Self Care | Payer: PPO | Source: Ambulatory Visit | Attending: Family Medicine | Admitting: Family Medicine

## 2018-12-29 ENCOUNTER — Other Ambulatory Visit: Payer: Self-pay

## 2018-12-29 DIAGNOSIS — Z1231 Encounter for screening mammogram for malignant neoplasm of breast: Secondary | ICD-10-CM

## 2019-02-15 ENCOUNTER — Other Ambulatory Visit (INDEPENDENT_AMBULATORY_CARE_PROVIDER_SITE_OTHER): Payer: Self-pay | Admitting: Orthopaedic Surgery

## 2019-02-24 DIAGNOSIS — Z0000 Encounter for general adult medical examination without abnormal findings: Secondary | ICD-10-CM | POA: Diagnosis not present

## 2019-02-24 DIAGNOSIS — E039 Hypothyroidism, unspecified: Secondary | ICD-10-CM | POA: Diagnosis not present

## 2019-02-24 DIAGNOSIS — K219 Gastro-esophageal reflux disease without esophagitis: Secondary | ICD-10-CM | POA: Diagnosis not present

## 2019-02-24 DIAGNOSIS — J3089 Other allergic rhinitis: Secondary | ICD-10-CM | POA: Diagnosis not present

## 2019-02-24 DIAGNOSIS — Z79899 Other long term (current) drug therapy: Secondary | ICD-10-CM | POA: Diagnosis not present

## 2019-02-24 DIAGNOSIS — E785 Hyperlipidemia, unspecified: Secondary | ICD-10-CM | POA: Diagnosis not present

## 2019-02-24 DIAGNOSIS — R7303 Prediabetes: Secondary | ICD-10-CM | POA: Diagnosis not present

## 2019-02-24 DIAGNOSIS — E559 Vitamin D deficiency, unspecified: Secondary | ICD-10-CM | POA: Diagnosis not present

## 2019-02-24 DIAGNOSIS — F325 Major depressive disorder, single episode, in full remission: Secondary | ICD-10-CM | POA: Diagnosis not present

## 2019-03-22 DIAGNOSIS — Z23 Encounter for immunization: Secondary | ICD-10-CM | POA: Diagnosis not present

## 2019-04-01 DIAGNOSIS — R7303 Prediabetes: Secondary | ICD-10-CM | POA: Diagnosis not present

## 2019-04-01 DIAGNOSIS — E559 Vitamin D deficiency, unspecified: Secondary | ICD-10-CM | POA: Diagnosis not present

## 2019-04-01 DIAGNOSIS — Z79899 Other long term (current) drug therapy: Secondary | ICD-10-CM | POA: Diagnosis not present

## 2019-04-01 DIAGNOSIS — E039 Hypothyroidism, unspecified: Secondary | ICD-10-CM | POA: Diagnosis not present

## 2019-04-01 DIAGNOSIS — K219 Gastro-esophageal reflux disease without esophagitis: Secondary | ICD-10-CM | POA: Diagnosis not present

## 2019-04-01 DIAGNOSIS — E785 Hyperlipidemia, unspecified: Secondary | ICD-10-CM | POA: Diagnosis not present

## 2019-04-17 ENCOUNTER — Other Ambulatory Visit (INDEPENDENT_AMBULATORY_CARE_PROVIDER_SITE_OTHER): Payer: Self-pay | Admitting: Orthopaedic Surgery

## 2019-06-10 ENCOUNTER — Other Ambulatory Visit (INDEPENDENT_AMBULATORY_CARE_PROVIDER_SITE_OTHER): Payer: Self-pay | Admitting: Orthopaedic Surgery

## 2019-07-12 ENCOUNTER — Other Ambulatory Visit (INDEPENDENT_AMBULATORY_CARE_PROVIDER_SITE_OTHER): Payer: Self-pay | Admitting: Orthopaedic Surgery

## 2019-07-15 DIAGNOSIS — H524 Presbyopia: Secondary | ICD-10-CM | POA: Diagnosis not present

## 2019-07-15 DIAGNOSIS — H2513 Age-related nuclear cataract, bilateral: Secondary | ICD-10-CM | POA: Diagnosis not present

## 2019-07-15 DIAGNOSIS — H43813 Vitreous degeneration, bilateral: Secondary | ICD-10-CM | POA: Diagnosis not present

## 2019-07-15 DIAGNOSIS — H5203 Hypermetropia, bilateral: Secondary | ICD-10-CM | POA: Diagnosis not present

## 2019-07-24 ENCOUNTER — Ambulatory Visit: Payer: PPO | Attending: Internal Medicine

## 2019-08-08 ENCOUNTER — Other Ambulatory Visit (INDEPENDENT_AMBULATORY_CARE_PROVIDER_SITE_OTHER): Payer: Self-pay | Admitting: Orthopaedic Surgery

## 2019-08-12 ENCOUNTER — Ambulatory Visit: Payer: PPO

## 2019-08-12 DIAGNOSIS — R05 Cough: Secondary | ICD-10-CM | POA: Diagnosis not present

## 2019-08-12 DIAGNOSIS — Z1159 Encounter for screening for other viral diseases: Secondary | ICD-10-CM | POA: Diagnosis not present

## 2019-08-25 DIAGNOSIS — H2511 Age-related nuclear cataract, right eye: Secondary | ICD-10-CM | POA: Diagnosis not present

## 2019-08-25 DIAGNOSIS — H25811 Combined forms of age-related cataract, right eye: Secondary | ICD-10-CM | POA: Diagnosis not present

## 2019-09-07 ENCOUNTER — Other Ambulatory Visit (INDEPENDENT_AMBULATORY_CARE_PROVIDER_SITE_OTHER): Payer: Self-pay | Admitting: Orthopaedic Surgery

## 2019-09-27 DIAGNOSIS — R05 Cough: Secondary | ICD-10-CM | POA: Diagnosis not present

## 2019-09-27 DIAGNOSIS — J309 Allergic rhinitis, unspecified: Secondary | ICD-10-CM | POA: Diagnosis not present

## 2019-09-29 DIAGNOSIS — H2512 Age-related nuclear cataract, left eye: Secondary | ICD-10-CM | POA: Diagnosis not present

## 2019-10-07 ENCOUNTER — Other Ambulatory Visit (INDEPENDENT_AMBULATORY_CARE_PROVIDER_SITE_OTHER): Payer: Self-pay | Admitting: Orthopaedic Surgery

## 2019-10-07 NOTE — Telephone Encounter (Signed)
Please advise 

## 2019-11-06 ENCOUNTER — Other Ambulatory Visit (INDEPENDENT_AMBULATORY_CARE_PROVIDER_SITE_OTHER): Payer: Self-pay | Admitting: Orthopaedic Surgery

## 2019-12-06 ENCOUNTER — Other Ambulatory Visit (INDEPENDENT_AMBULATORY_CARE_PROVIDER_SITE_OTHER): Payer: Self-pay | Admitting: Orthopaedic Surgery

## 2020-01-05 ENCOUNTER — Other Ambulatory Visit (INDEPENDENT_AMBULATORY_CARE_PROVIDER_SITE_OTHER): Payer: Self-pay | Admitting: Orthopaedic Surgery

## 2020-01-05 NOTE — Telephone Encounter (Signed)
Can you please advise while CB out?

## 2020-02-04 ENCOUNTER — Other Ambulatory Visit (INDEPENDENT_AMBULATORY_CARE_PROVIDER_SITE_OTHER): Payer: Self-pay | Admitting: Family

## 2020-02-08 NOTE — Telephone Encounter (Signed)
Cb pt 

## 2020-02-28 DIAGNOSIS — Z0000 Encounter for general adult medical examination without abnormal findings: Secondary | ICD-10-CM | POA: Diagnosis not present

## 2020-02-28 DIAGNOSIS — R7303 Prediabetes: Secondary | ICD-10-CM | POA: Diagnosis not present

## 2020-02-28 DIAGNOSIS — M8588 Other specified disorders of bone density and structure, other site: Secondary | ICD-10-CM | POA: Diagnosis not present

## 2020-02-28 DIAGNOSIS — E785 Hyperlipidemia, unspecified: Secondary | ICD-10-CM | POA: Diagnosis not present

## 2020-02-28 DIAGNOSIS — K219 Gastro-esophageal reflux disease without esophagitis: Secondary | ICD-10-CM | POA: Diagnosis not present

## 2020-02-28 DIAGNOSIS — E039 Hypothyroidism, unspecified: Secondary | ICD-10-CM | POA: Diagnosis not present

## 2020-02-28 DIAGNOSIS — Z1159 Encounter for screening for other viral diseases: Secondary | ICD-10-CM | POA: Diagnosis not present

## 2020-02-28 DIAGNOSIS — E559 Vitamin D deficiency, unspecified: Secondary | ICD-10-CM | POA: Diagnosis not present

## 2020-02-28 DIAGNOSIS — F419 Anxiety disorder, unspecified: Secondary | ICD-10-CM | POA: Diagnosis not present

## 2020-02-28 DIAGNOSIS — J309 Allergic rhinitis, unspecified: Secondary | ICD-10-CM | POA: Diagnosis not present

## 2020-02-28 DIAGNOSIS — M170 Bilateral primary osteoarthritis of knee: Secondary | ICD-10-CM | POA: Diagnosis not present

## 2020-02-28 DIAGNOSIS — F325 Major depressive disorder, single episode, in full remission: Secondary | ICD-10-CM | POA: Diagnosis not present

## 2020-03-01 ENCOUNTER — Other Ambulatory Visit: Payer: Self-pay | Admitting: Family Medicine

## 2020-03-01 DIAGNOSIS — M8580 Other specified disorders of bone density and structure, unspecified site: Secondary | ICD-10-CM

## 2020-03-01 DIAGNOSIS — Z1231 Encounter for screening mammogram for malignant neoplasm of breast: Secondary | ICD-10-CM

## 2020-03-07 DIAGNOSIS — E039 Hypothyroidism, unspecified: Secondary | ICD-10-CM | POA: Diagnosis not present

## 2020-03-07 DIAGNOSIS — Z23 Encounter for immunization: Secondary | ICD-10-CM | POA: Diagnosis not present

## 2020-03-07 DIAGNOSIS — Z1159 Encounter for screening for other viral diseases: Secondary | ICD-10-CM | POA: Diagnosis not present

## 2020-03-07 DIAGNOSIS — E559 Vitamin D deficiency, unspecified: Secondary | ICD-10-CM | POA: Diagnosis not present

## 2020-03-07 DIAGNOSIS — R7303 Prediabetes: Secondary | ICD-10-CM | POA: Diagnosis not present

## 2020-03-07 DIAGNOSIS — E785 Hyperlipidemia, unspecified: Secondary | ICD-10-CM | POA: Diagnosis not present

## 2020-05-14 ENCOUNTER — Other Ambulatory Visit: Payer: Self-pay

## 2020-05-14 MED ORDER — DICLOFENAC SODIUM 75 MG PO TBEC
75.0000 mg | DELAYED_RELEASE_TABLET | Freq: Two times a day (BID) | ORAL | 1 refills | Status: DC | PRN
Start: 2020-05-14 — End: 2020-09-10

## 2020-06-13 ENCOUNTER — Ambulatory Visit
Admission: RE | Admit: 2020-06-13 | Discharge: 2020-06-13 | Disposition: A | Discharge status: Home / Self Care | Payer: PPO | Source: Ambulatory Visit | Attending: Family Medicine | Admitting: Family Medicine

## 2020-06-13 ENCOUNTER — Other Ambulatory Visit: Payer: Self-pay

## 2020-06-13 ENCOUNTER — Other Ambulatory Visit: Payer: PPO

## 2020-06-13 DIAGNOSIS — Z1231 Encounter for screening mammogram for malignant neoplasm of breast: Secondary | ICD-10-CM

## 2020-06-26 DIAGNOSIS — Z79899 Other long term (current) drug therapy: Secondary | ICD-10-CM | POA: Diagnosis not present

## 2020-06-26 DIAGNOSIS — E785 Hyperlipidemia, unspecified: Secondary | ICD-10-CM | POA: Diagnosis not present

## 2020-07-17 DIAGNOSIS — H43813 Vitreous degeneration, bilateral: Secondary | ICD-10-CM | POA: Diagnosis not present

## 2020-07-17 DIAGNOSIS — H04123 Dry eye syndrome of bilateral lacrimal glands: Secondary | ICD-10-CM | POA: Diagnosis not present

## 2020-07-17 DIAGNOSIS — Z961 Presence of intraocular lens: Secondary | ICD-10-CM | POA: Diagnosis not present

## 2020-09-10 ENCOUNTER — Other Ambulatory Visit: Payer: Self-pay | Admitting: Orthopaedic Surgery

## 2020-09-14 ENCOUNTER — Other Ambulatory Visit: Payer: PPO

## 2020-10-15 ENCOUNTER — Encounter: Payer: Self-pay | Admitting: Gastroenterology

## 2020-10-24 ENCOUNTER — Ambulatory Visit
Admission: RE | Admit: 2020-10-24 | Discharge: 2020-10-24 | Disposition: A | Discharge status: Home / Self Care | Payer: PPO | Source: Ambulatory Visit | Attending: Family Medicine | Admitting: Family Medicine

## 2020-10-24 ENCOUNTER — Other Ambulatory Visit: Payer: Self-pay

## 2020-10-24 DIAGNOSIS — M8580 Other specified disorders of bone density and structure, unspecified site: Secondary | ICD-10-CM

## 2020-10-24 DIAGNOSIS — M85851 Other specified disorders of bone density and structure, right thigh: Secondary | ICD-10-CM | POA: Diagnosis not present

## 2020-10-24 DIAGNOSIS — M810 Age-related osteoporosis without current pathological fracture: Secondary | ICD-10-CM | POA: Diagnosis not present

## 2020-10-26 DIAGNOSIS — M810 Age-related osteoporosis without current pathological fracture: Secondary | ICD-10-CM | POA: Diagnosis not present

## 2020-11-16 ENCOUNTER — Encounter: Payer: Self-pay | Admitting: Gastroenterology

## 2020-12-28 ENCOUNTER — Other Ambulatory Visit: Payer: Self-pay | Admitting: Orthopaedic Surgery

## 2020-12-28 NOTE — Telephone Encounter (Signed)
ok 

## 2021-02-14 DIAGNOSIS — E559 Vitamin D deficiency, unspecified: Secondary | ICD-10-CM | POA: Insufficient documentation

## 2021-02-14 DIAGNOSIS — E785 Hyperlipidemia, unspecified: Secondary | ICD-10-CM | POA: Insufficient documentation

## 2021-02-14 DIAGNOSIS — M8580 Other specified disorders of bone density and structure, unspecified site: Secondary | ICD-10-CM | POA: Insufficient documentation

## 2021-02-14 DIAGNOSIS — F325 Major depressive disorder, single episode, in full remission: Secondary | ICD-10-CM | POA: Insufficient documentation

## 2021-02-14 DIAGNOSIS — J309 Allergic rhinitis, unspecified: Secondary | ICD-10-CM | POA: Insufficient documentation

## 2021-02-14 DIAGNOSIS — L438 Other lichen planus: Secondary | ICD-10-CM | POA: Insufficient documentation

## 2021-02-14 DIAGNOSIS — R7303 Prediabetes: Secondary | ICD-10-CM | POA: Insufficient documentation

## 2021-02-14 DIAGNOSIS — M1710 Unilateral primary osteoarthritis, unspecified knee: Secondary | ICD-10-CM | POA: Insufficient documentation

## 2021-02-14 DIAGNOSIS — M810 Age-related osteoporosis without current pathological fracture: Secondary | ICD-10-CM | POA: Insufficient documentation

## 2021-02-14 DIAGNOSIS — E039 Hypothyroidism, unspecified: Secondary | ICD-10-CM | POA: Insufficient documentation

## 2021-02-14 DIAGNOSIS — M179 Osteoarthritis of knee, unspecified: Secondary | ICD-10-CM | POA: Insufficient documentation

## 2021-02-14 DIAGNOSIS — F419 Anxiety disorder, unspecified: Secondary | ICD-10-CM | POA: Insufficient documentation

## 2021-02-26 ENCOUNTER — Encounter: Payer: Self-pay | Admitting: Gastroenterology

## 2021-02-26 ENCOUNTER — Other Ambulatory Visit: Payer: Self-pay

## 2021-02-26 ENCOUNTER — Ambulatory Visit (AMBULATORY_SURGERY_CENTER): Payer: PPO

## 2021-02-26 VITALS — Ht 61.0 in | Wt 145.0 lb

## 2021-02-26 DIAGNOSIS — Z1211 Encounter for screening for malignant neoplasm of colon: Secondary | ICD-10-CM

## 2021-02-26 MED ORDER — NA SULFATE-K SULFATE-MG SULF 17.5-3.13-1.6 GM/177ML PO SOLN: 1.0000 | Freq: Once | ORAL | 0 refills | Status: AC

## 2021-02-26 NOTE — Progress Notes (Signed)
Pre visit completed via phone call; Patient verified name, DOB, and address;  No egg or soy allergy known to patient  No issues known to pt with past sedation with any surgeries or procedures Patient denies ever being told they had issues or difficulty with intubation  No FH of Malignant Hyperthermia Pt is not on diet pills Pt is not on  home 02  Pt is not on blood thinners  Pt reports issues with constipation - increases veggies and increases po intake No A fib or A flutter  EMMI video via MyChart  COVID 19 guidelines implemented in PV today with Pt and RN  Pt is fully vaccinated for Covid x 2 + boosters; NO PA's for preps discussed with pt in PV today  Discussed with pt there will be an out-of-pocket cost for prep and that varies from $0 to 70 +  dollars  Due to the COVID-19 pandemic we are asking patients to follow certain guidelines.  Pt aware of COVID protocols and [facility] guidelines

## 2021-03-01 DIAGNOSIS — F325 Major depressive disorder, single episode, in full remission: Secondary | ICD-10-CM | POA: Diagnosis not present

## 2021-03-01 DIAGNOSIS — Z0000 Encounter for general adult medical examination without abnormal findings: Secondary | ICD-10-CM | POA: Diagnosis not present

## 2021-03-01 DIAGNOSIS — E039 Hypothyroidism, unspecified: Secondary | ICD-10-CM | POA: Diagnosis not present

## 2021-03-01 DIAGNOSIS — E785 Hyperlipidemia, unspecified: Secondary | ICD-10-CM | POA: Diagnosis not present

## 2021-03-01 DIAGNOSIS — M810 Age-related osteoporosis without current pathological fracture: Secondary | ICD-10-CM | POA: Diagnosis not present

## 2021-03-01 DIAGNOSIS — R7303 Prediabetes: Secondary | ICD-10-CM | POA: Diagnosis not present

## 2021-03-01 DIAGNOSIS — M170 Bilateral primary osteoarthritis of knee: Secondary | ICD-10-CM | POA: Diagnosis not present

## 2021-03-01 DIAGNOSIS — E559 Vitamin D deficiency, unspecified: Secondary | ICD-10-CM | POA: Diagnosis not present

## 2021-03-01 DIAGNOSIS — K219 Gastro-esophageal reflux disease without esophagitis: Secondary | ICD-10-CM | POA: Diagnosis not present

## 2021-03-01 DIAGNOSIS — J3089 Other allergic rhinitis: Secondary | ICD-10-CM | POA: Diagnosis not present

## 2021-03-06 ENCOUNTER — Other Ambulatory Visit: Payer: Self-pay

## 2021-03-06 ENCOUNTER — Encounter: Payer: Self-pay | Admitting: Gastroenterology

## 2021-03-06 ENCOUNTER — Ambulatory Visit (AMBULATORY_SURGERY_CENTER): Payer: PPO | Admitting: Gastroenterology

## 2021-03-06 VITALS — BP 128/79 | HR 82 | Temp 98.1°F | Resp 10 | Ht 61.0 in | Wt 145.0 lb

## 2021-03-06 DIAGNOSIS — Z1211 Encounter for screening for malignant neoplasm of colon: Secondary | ICD-10-CM

## 2021-03-06 MED ORDER — SODIUM CHLORIDE 0.9 % IV SOLN: 500.0000 mL | Freq: Once | INTRAVENOUS | Status: AC

## 2021-03-06 NOTE — Patient Instructions (Signed)
Resume previous diet and continue present medications. No repeat Colonoscopy due to age. Return to GI office as needed. YOU HAD AN ENDOSCOPIC PROCEDURE TODAY AT THE [facility]:   Refer to the procedure report that was given to you for any specific questions about what was found during the examination.  If the procedure report does not answer your questions, please call your gastroenterologist to clarify.  If you requested that your care partner not be given the details of your procedure findings, then the procedure report has been included in a sealed envelope for you to review at your convenience later.  YOU SHOULD EXPECT: Some feelings of bloating in the abdomen. Passage of more gas than usual.  Walking can help get rid of the air that was put into your GI tract during the procedure and reduce the bloating. If you had a lower endoscopy (such as a colonoscopy or flexible sigmoidoscopy) you may notice spotting of blood in your stool or on the toilet paper. If you underwent a bowel prep for your procedure, you may not have a normal bowel movement for a few days.  Please Note:  You might notice some irritation and congestion in your nose or some drainage.  This is from the oxygen used during your procedure.  There is no need for concern and it should clear up in a day or so.  SYMPTOMS TO REPORT IMMEDIATELY:  Following lower endoscopy (colonoscopy or flexible sigmoidoscopy):  Excessive amounts of blood in the stool  Significant tenderness or worsening of abdominal pains  Swelling of the abdomen that is new, acute  Fever of 100F or higher  For urgent or emergent issues, a gastroenterologist can be reached at any hour by calling [phone]. Do not use MyChart messaging for urgent concerns.    DIET:  We do recommend a small meal at first, but then you may proceed to your regular diet.  Drink plenty of fluids but you should avoid alcoholic beverages for 24 hours.  ACTIVITY:  You  should plan to take it easy for the rest of today and you should NOT DRIVE or use heavy machinery until tomorrow (because of the sedation medicines used during the test).    FOLLOW UP: Our staff will call the number listed on your records 48-72 hours following your procedure to check on you and address any questions or concerns that you may have regarding the information given to you following your procedure. If we do not reach you, we will leave a message.  We will attempt to reach you two times.  During this call, we will ask if you have developed any symptoms of COVID 19. If you develop any symptoms (ie: fever, flu-like symptoms, shortness of breath, cough etc.) before then, please call [phone].  If you test positive for Covid 19 in the 2 weeks post procedure, please call and report this information to us.    If any biopsies were taken you will be contacted by phone or by letter within the next 1-3 weeks.  Please call us at [phone] if you have not heard about the biopsies in 3 weeks.    SIGNATURES/CONFIDENTIALITY: You and/or your care partner have signed paperwork which will be entered into your electronic medical record.  These signatures attest to the fact that that the information above on your After Visit Summary has been reviewed and is understood.  Full responsibility of the confidentiality of this discharge information lies with you and/or your care-partner.  

## 2021-03-06 NOTE — Op Note (Signed)
[facility] Patient Name: Vanessa Edwards Procedure Date: 03/06/2021 9:08 AM MRN: [id number] Endoscopist: Napoleon Form , MD Age: 70 Referring MD:  Date of Birth: 1951-01-30 Gender: Female Account #: [id number] Procedure:                Colonoscopy Indications:              Screening for colorectal malignant neoplasm Medicines:                Monitored Anesthesia Care Procedure:                Pre-Anesthesia Assessment:                           - Prior to the procedure, a History and Physical                            was performed, and patient medications and                            allergies were reviewed. The patient's tolerance of                            previous anesthesia was also reviewed. The risks                            and benefits of the procedure and the sedation                            options and risks were discussed with the patient.                            All questions were answered, and informed consent                            was obtained. Prior Anticoagulants: The patient has                            taken no previous anticoagulant or antiplatelet                            agents. ASA Grade Assessment: II - A patient with                            mild systemic disease. After reviewing the risks                            and benefits, the patient was deemed in                            satisfactory condition to undergo the procedure.                           After obtaining informed consent, the colonoscope  was passed under direct vision. Throughout the                            procedure, the patient's blood pressure, pulse, and                            oxygen saturations were monitored continuously. The                            PCF-HQ190L Colonoscope was introduced through the                            anus and advanced to the the cecum, identified by                            appendiceal  orifice and ileocecal valve. The                            colonoscopy was performed without difficulty. The                            patient tolerated the procedure well. The quality                            of the bowel preparation was excellent. The                            ileocecal valve, appendiceal orifice, and rectum                            were photographed. Scope In: 9:56:11 AM Scope Out: 10:09:34 AM Scope Withdrawal Time: 0 hours 8 minutes 28 seconds  Total Procedure Duration: 0 hours 13 minutes 23 seconds  Findings:                 The perianal and digital rectal examinations were                            normal.                           A few small-mouthed diverticula were found in the                            sigmoid colon and ascending colon.                           Non-bleeding external and internal hemorrhoids were                            found during retroflexion. The hemorrhoids were                            medium-sized.  The exam was otherwise without abnormality. Complications:            No immediate complications. Estimated Blood Loss:     Estimated blood loss: none. Impression:               - Diverticulosis in the sigmoid colon and in the                            ascending colon.                           - Non-bleeding external and internal hemorrhoids.                           - The examination was otherwise normal.                           - No specimens collected. Recommendation:           - Patient has a contact number available for                            emergencies. The signs and symptoms of potential                            delayed complications were discussed with the                            patient. Return to normal activities tomorrow.                            Written discharge instructions were provided to the                            patient.                           - Resume  previous diet.                           - Continue present medications.                           - No repeat colonoscopy due to age.                           - Return to GI clinic PRN. Napoleon Form, MD 03/06/2021 10:17:23 AM This report has been signed electronically.

## 2021-03-06 NOTE — Progress Notes (Signed)
Pt's states no medical or surgical changes since previsit or office visit.  ° °Vitals CW °

## 2021-03-06 NOTE — Progress Notes (Signed)
[facility] History and Physical   Primary Care Physician:  [name], MD   Reason for Procedure:  Colorectal cancer screening  Plan:    Screening colonoscopy with possible interventions as needed     HPI: Vanessa Edwards is a very pleasant 70 y.o. female here for screening colonoscopy. Denies any nausea, vomiting, abdominal pain, melena or bright red blood per rectum  The risks and benefits as well as alternatives of endoscopic procedure(s) have been discussed and reviewed. All questions answered. The patient agrees to proceed.    Past Medical History:  Diagnosis Date   Anemia    hx of   Arthritis    generalized   Cataract 2020   bilateral sx   Depression    Esophageal stricture    GERD (gastroesophageal reflux disease)    Hiatal hernia    Hyperlipidemia    on meds   Internal hemorrhoids    Osteoporosis    on meds   Seasonal allergies    Thyroid disease    on meds    Past Surgical History:  Procedure Laterality Date   CATARACT EXTRACTION, BILATERAL Bilateral 2020   COLONOSCOPY  2012   DB-F/V-mov(good)-normal + hems   WISDOM TOOTH EXTRACTION      Prior to Admission medications   Medication Sig Start Date End Date Taking? Authorizing Provider  citalopram (CELEXA) 10 MG tablet Take 10 mg by mouth daily.   Yes [provider]  diclofenac (VOLTAREN) 75 MG EC tablet TAKE ONE TABLET BY MOUTH TWICE A DAY AS NEEDED 12/28/20  Yes Kathryne Hitch, MD  fluticasone [facility]) 50 MCG/ACT nasal spray Place 1 spray into both nostrils daily at 6 (six) AM. 12/19/20  Yes [provider]  Omega-3 Fatty Acids (FISH OIL) 1000 MG CAPS Take 1 capsule by mouth daily at 6 (six) AM.   Yes [provider]  omeprazole (PRILOSEC) 20 MG capsule TAKE ONE CAPSULE BY MOUTH DAILY 02/08/18  Yes Zyion Doxtater V, MD  rosuvastatin (CRESTOR) 20 MG tablet Take 20 mg by mouth at bedtime. 12/27/20  Yes [provider]  SYNTHROID 50 MCG  tablet Take 50 mcg by mouth daily. 02/25/21  Yes [provider]    Current Outpatient Medications  Medication Sig Dispense Refill   citalopram (CELEXA) 10 MG tablet Take 10 mg by mouth daily.     diclofenac (VOLTAREN) 75 MG EC tablet TAKE ONE TABLET BY MOUTH TWICE A DAY AS NEEDED 60 tablet 1   fluticasone (FLONASE) 50 MCG/ACT nasal spray Place 1 spray into both nostrils daily at 6 (six) AM.     Omega-3 Fatty Acids (FISH OIL) 1000 MG CAPS Take 1 capsule by mouth daily at 6 (six) AM.     omeprazole (PRILOSEC) 20 MG capsule TAKE ONE CAPSULE BY MOUTH DAILY 90 capsule 0   rosuvastatin (CRESTOR) 20 MG tablet Take 20 mg by mouth at bedtime.     SYNTHROID 50 MCG tablet Take 50 mcg by mouth daily.     Current Facility-Administered Medications  Medication Dose Route Frequency Provider Last Rate Last Admin   0.9 %  sodium chloride infusion  500 mL Intravenous Once Napoleon Form, MD        Allergies as of 03/06/2021 - Review Complete 03/06/2021  Allergen Reaction Noted   Alendronate sodium  10/26/2020   Atorvastatin  10/26/2020   Ibandronic acid Other (See Comments) 10/26/2020   Sulfacetamide sodium Hives 10/26/2020   Sulfamethoxazole-trimethoprim Hives 10/26/2020   Amoxicillin Rash  09/03/2010   Codeine Anxiety 09/03/2010   Cymbalta [duloxetine hcl] Palpitations 04/03/2014   Nabumetone Rash 09/03/2010    Family History  Problem Relation Age of Onset   Throat cancer Mother        smoker   Colon cancer Neg Hx    Stomach cancer Neg Hx    Esophageal cancer Neg Hx     Social History   Socioeconomic History   Marital status: Married    Spouse name: Not on file   Number of children: Not on file   Years of education: Not on file   Highest education level: Not on file  Occupational History   Not on file  Tobacco Use   Smoking status: Never   Smokeless tobacco: Never  Vaping Use   Vaping Use: Never used  Substance and Sexual Activity   Alcohol use: Yes     Alcohol/week: 14.0 standard drinks    Types: 14 Glasses of wine per week    Comment: wine   Drug use: No   Sexual activity: Not Currently    Partners: Male  Other Topics Concern   Not on file  Social History Narrative   Not on file   Social Determinants of Health   Financial Resource Strain: Not on file  Food Insecurity: Not on file  Transportation Needs: Not on file  Physical Activity: Not on file  Stress: Not on file  Social Connections: Not on file  Intimate Partner Violence: Not on file    Review of Systems:  All other review of systems negative except as mentioned in the HPI.  Physical Exam: Vital signs in last 24 hours: BP 132/74   Pulse 87   Temp 98.1 F (36.7 C)   Ht 5\' 1"  (1.549 m)   Wt 145 lb (65.8 kg)   SpO2 98%   BMI 27.40 kg/m     General:   Alert, NAD Lungs:  Clear .   Heart:  Regular rate and rhythm Abdomen:  Soft, nontender and nondistended. Neuro/Psych:  Alert and cooperative. Normal mood and affect. A and O x 3  Reviewed labs, radiology imaging, old records and pertinent past GI work up  Patient is appropriate for planned procedure(s) and anesthesia in an ambulatory setting   K. , MD [phone]

## 2021-03-06 NOTE — Progress Notes (Signed)
To pacu, VSS. Report to [email or web address].tb 

## 2021-03-08 ENCOUNTER — Telehealth: Payer: Self-pay | Admitting: *Deleted

## 2021-03-08 ENCOUNTER — Telehealth: Payer: Self-pay

## 2021-03-08 NOTE — Telephone Encounter (Signed)
Left message on answering machine. 

## 2021-03-08 NOTE — Telephone Encounter (Signed)
Attempted to call patient for their post-procedure follow-up call. No answer. Left voicemail.   

## 2021-04-03 DIAGNOSIS — Z23 Encounter for immunization: Secondary | ICD-10-CM | POA: Diagnosis not present

## 2021-04-22 ENCOUNTER — Other Ambulatory Visit: Payer: Self-pay | Admitting: Orthopaedic Surgery

## 2021-07-19 DIAGNOSIS — Z961 Presence of intraocular lens: Secondary | ICD-10-CM | POA: Diagnosis not present

## 2021-07-19 DIAGNOSIS — H04123 Dry eye syndrome of bilateral lacrimal glands: Secondary | ICD-10-CM | POA: Diagnosis not present

## 2021-07-19 DIAGNOSIS — H26491 Other secondary cataract, right eye: Secondary | ICD-10-CM | POA: Diagnosis not present

## 2021-08-04 ENCOUNTER — Other Ambulatory Visit: Payer: Self-pay | Admitting: Orthopaedic Surgery

## 2021-10-08 DIAGNOSIS — H0100A Unspecified blepharitis right eye, upper and lower eyelids: Secondary | ICD-10-CM | POA: Diagnosis not present

## 2021-10-08 DIAGNOSIS — H18593 Other hereditary corneal dystrophies, bilateral: Secondary | ICD-10-CM | POA: Diagnosis not present

## 2021-10-08 DIAGNOSIS — H43821 Vitreomacular adhesion, right eye: Secondary | ICD-10-CM | POA: Diagnosis not present

## 2021-10-08 DIAGNOSIS — H04123 Dry eye syndrome of bilateral lacrimal glands: Secondary | ICD-10-CM | POA: Diagnosis not present

## 2021-10-11 ENCOUNTER — Encounter: Payer: Self-pay | Admitting: Physician Assistant

## 2021-10-11 ENCOUNTER — Ambulatory Visit: Payer: PPO | Admitting: Physician Assistant

## 2021-10-11 VITALS — BP 122/76 | HR 91 | Ht 61.0 in | Wt 149.2 lb

## 2021-10-11 DIAGNOSIS — K648 Other hemorrhoids: Secondary | ICD-10-CM | POA: Diagnosis not present

## 2021-10-11 MED ORDER — HYDROCORTISONE ACETATE 25 MG RE SUPP: 25.0000 mg | Freq: Every evening | RECTAL | 4 refills | Status: DC

## 2021-10-11 NOTE — Patient Instructions (Signed)
If you are age 72 or older, your body mass index should be between 23-30. Your Body mass index is 28.19 kg/m?Marland Kitchen If this is out of the aforementioned range listed, please consider follow up with your Primary Care Provider. ?________________________________________________________ ? ?The [facility] providers would like to encourage you to use [facility] to communicate with providers for non-urgent requests or questions.  Due to long hold times on the telephone, sending your provider a message by [facility] may be a faster and more efficient way to get a response.  Please allow 48 business hours for a response.  Please remember that this is for non-urgent requests.  ?_______________________________________________________ ? ?START Hydrocortisone suppositories 1 suppository at bedtime for 7-10 days. ? ?You have been scheduled with Dr. Lavon Paganini for a Hemorrhoid banding on November 27, 2021 at 3:30 pm ? ?Thank you for entrusting me with your care and choosing [facility]. ? ?Mike Gip, PA-C ?

## 2021-10-11 NOTE — Progress Notes (Signed)
? ?Subjective:  ? ? Patient ID: Vanessa Edwards, female    DOB: 24-Jun-1950, 71 y.o.   MRN: [id number] ? ?HPI ? Vanessa Edwards is a pleasant 71 year old female, established with Dr. Silverio Decamp who comes in today to discuss hemorrhoidal symptoms. ?Patient was last seen in 2022 when she had colonoscopy on 03/06/2021.  She was found to have a few sigmoid and ascending colon diverticuli and medium sized internal hemorrhoids, no polyps. ?Patient had undergone prior hemorrhoidal banding x2 sessions in 2018.  She also has history of GERD with prior stricture. ?She says she has been having bright red blood in the commode with each bowel movement over the past 2 and half weeks.  Stool is normal in color.  She is not having any problems with abdominal pain or cramping, no change in bowel habits, no constipation or straining.  No anorectal discomfort.  She says she has had bleeding in the past but it has never continued for this long. ? ?Review of Systems Pertinent positive and negative review of systems were noted in the above HPI section.  All other review of systems was otherwise negative.  ? ?Outpatient Encounter Medications as of 10/11/2021  ?Medication Sig  ? citalopram (CELEXA) 10 MG tablet Take 10 mg by mouth daily.  ? diclofenac (VOLTAREN) 75 MG EC tablet TAKE ONE TABLET BY MOUTH TWICE A DAY AS NEEDED  ? fluticasone (FLONASE) 50 MCG/ACT nasal spray Place 1 spray into both nostrils daily at 6 (six) AM.  ? hydrocortisone (ANUSOL-HC) 25 MG suppository Place 1 suppository (25 mg total) rectally at bedtime.  ? Omega-3 Fatty Acids (FISH OIL) 1000 MG CAPS Take 1 capsule by mouth daily at 6 (six) AM.  ? omeprazole (PRILOSEC) 20 MG capsule TAKE ONE CAPSULE BY MOUTH DAILY  ? rosuvastatin (CRESTOR) 20 MG tablet Take 20 mg by mouth at bedtime.  ? SYNTHROID 50 MCG tablet Take 50 mcg by mouth daily.  ? ?Facility-Administered Encounter Medications as of 10/11/2021  ?Medication  ? 0.9 %  sodium chloride infusion  ? ?Allergies  ?Allergen Reactions  ?  Alendronate Sodium   ?  Other reaction(s): joint pain/swallowing difficulty  ? Atorvastatin   ?  Other reaction(s): lichen planus  ? Ibandronic Acid Other (See Comments)  ? Sulfacetamide Sodium Hives  ? Sulfamethoxazole-Trimethoprim Hives  ? Amoxicillin Rash  ? Codeine Anxiety  ? Cymbalta [Duloxetine Hcl] Palpitations  ? Nabumetone Rash  ? ?Patient Active Problem List  ? Diagnosis Date Noted  ? Allergic rhinitis 02/14/2021  ? Major depression in complete remission ([place]) 02/14/2021  ? Prediabetes 02/14/2021  ? Age-related osteoporosis without current pathological fracture 02/14/2021  ? Anxiety 02/14/2021  ? Hyperlipidemia 02/14/2021  ? Hypothyroidism 02/14/2021  ? Oral lichen planus [id number]  ? Osteoarthritis of knee 02/14/2021  ? Osteopenia 02/14/2021  ? Vitamin D deficiency 02/14/2021  ? Digital mucous cyst of right hand 11/23/2017  ? Pain in finger of right hand 09/28/2017  ? Esophageal reflux 08/09/2015  ? ?Social History  ? ?Socioeconomic History  ? Marital status: Married  ?  Spouse name: Not on file  ? Number of children: Not on file  ? Years of education: Not on file  ? Highest education level: Not on file  ?Occupational History  ? Not on file  ?Tobacco Use  ? Smoking status: Never  ? Smokeless tobacco: Never  ?Vaping Use  ? Vaping Use: Never used  ?Substance and Sexual Activity  ? Alcohol use: Yes  ?  Alcohol/week: 14.0 standard drinks  ?  Types: 14 Glasses of wine per week  ?  Comment: wine  ? Drug use: No  ? Sexual activity: Not Currently  ?  Partners: Male  ?Other Topics Concern  ? Not on file  ?Social History Narrative  ? Not on file  ? ?Social Determinants of Health  ? ?Financial Resource Strain: Not on file  ?Food Insecurity: Not on file  ?Transportation Needs: Not on file  ?Physical Activity: Not on file  ?Stress: Not on file  ?Social Connections: Not on file  ?Intimate Partner Violence: Not on file  ? ? ?Vanessa Edwards's family history includes Throat cancer in her mother. ? ? ?   ?Objective:  ?   ?Vitals:  ? 10/11/21 0925  ?BP: 122/76  ?Pulse: 91  ?SpO2: 99%  ? ? ?Physical Exam Well-developed well-nourished WF  in no acute distress.  Height, Weight, 149 BMI 28 ? ?HEENT; nontraumatic normocephalic, EOMI, PE R LA, sclera anicteric. ? ?Rectal;not done today ?Skin; benign exam, no jaundice rash or appreciable lesions ?Extremities; no clubbing cyanosis or edema skin warm and dry ?Neuro/Psych; alert and oriented x4, grossly nonfocal mood and affect appropriate  ? ? ? ?   ?Assessment & Plan:  ? ?#19 71 year old white female with 2-1/2-week history of hematochezia with bright red blood noted in the commode with bowel movements and normal appearing stool.  No other associated symptoms. ?Patient has recently documented medium sized internal hemorrhoids on colonoscopy done September 2022, as well as a few diverticuli in the sigmoid and ascending colon. ?She has also undergone prior banding  sessions for internal hemorrhoids in 2018. ? ?Her bleeding is very consistent with that seen with symptomatic internal hemorrhoids. ? ?#2 GERD-stable on omeprazole 20 mg daily ?No dysphagia ? ?Plan; will start Anusol HC suppositories nightly x7 to 10 days, then repeat course as needed for recurrent bleeding. ?We discussed proceeding with repeat hemorrhoidal banding, she would like to see if the suppositories are helpful . ?She will be scheduled for appointment with Dr. Silverio Decamp for potential banding in June.  If her symptoms resolve in the interim, she can cancel that appointment ahead of time. ?Also advised her to call should she have persistent symptoms and/or any increase in bleeding after starting the suppositories. ? ? ? ?Odysseus Cada S Luella Gardenhire PA-C ?10/11/2021 ? ? ?Cc: Harlan Stains, MD ?  ?

## 2021-11-07 NOTE — Progress Notes (Signed)
Reviewed and agree with documentation and assessment and plan. K. Veena Kyree Fedorko , MD   

## 2021-11-12 DIAGNOSIS — J40 Bronchitis, not specified as acute or chronic: Secondary | ICD-10-CM | POA: Diagnosis not present

## 2021-11-25 IMAGING — MG DIGITAL SCREENING BILAT W/ TOMO W/ CAD
8 series · 8 of 24 positions shown · non-contrast
Comparison: Previous exam(s).

CLINICAL DATA: Screening.

EXAM:
DIGITAL SCREENING BILATERAL MAMMOGRAM WITH TOMO AND CAD

[L MLO synth-2D]
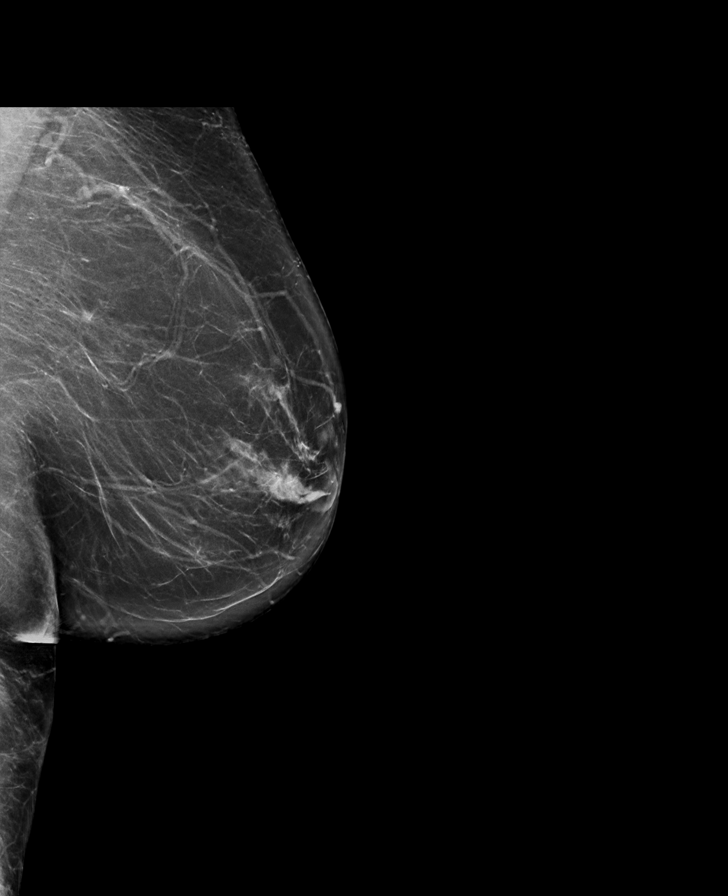

[R CC synth-2D]
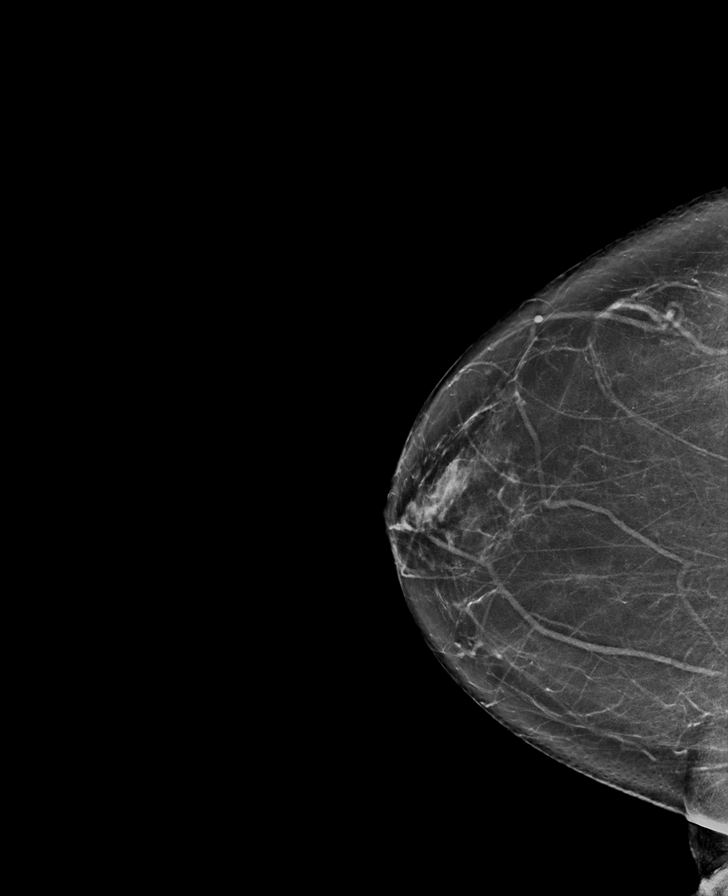

[R MLO synth-2D]
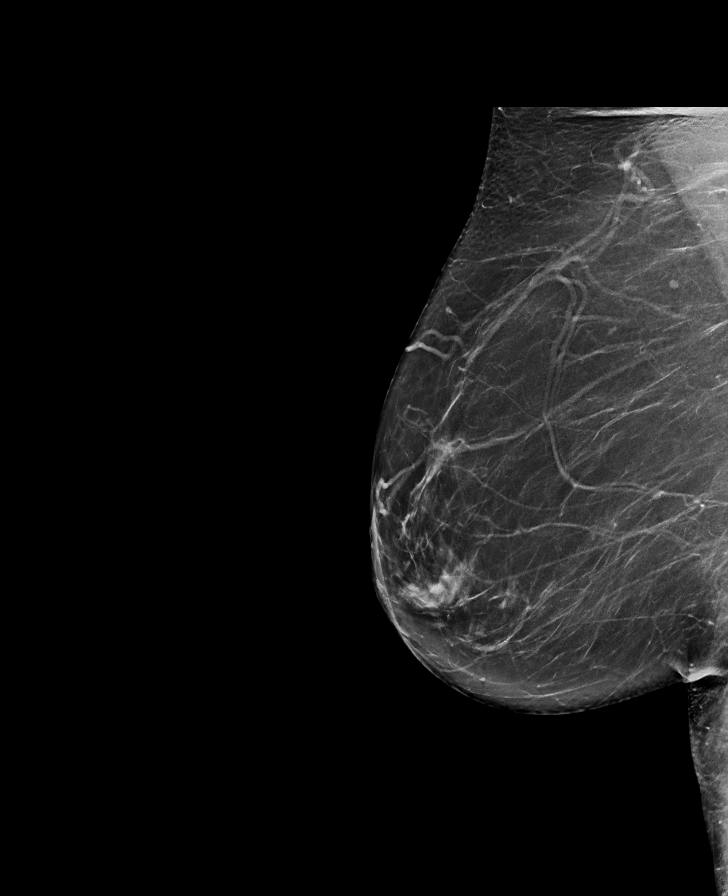

[L CC synth-2D]
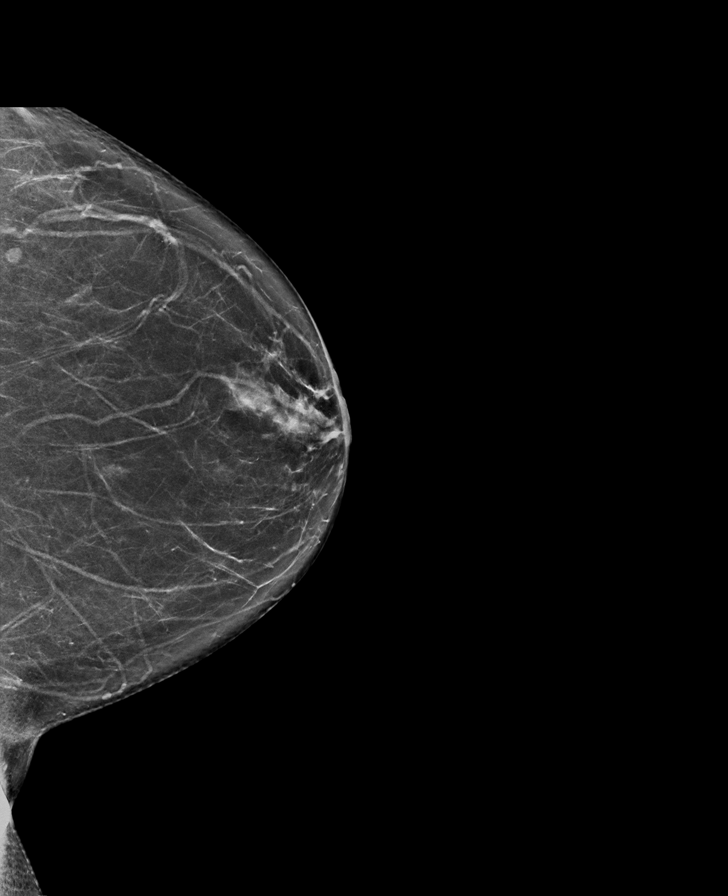

[L CC tomo · tomo slice 36/71.0]
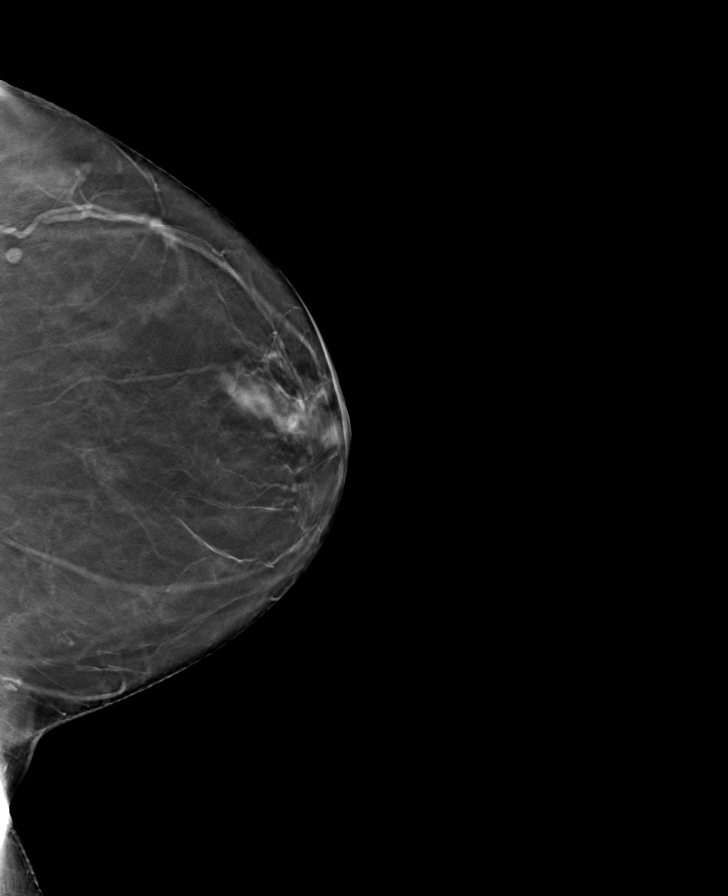

[R CC tomo · tomo slice 35/69.0]
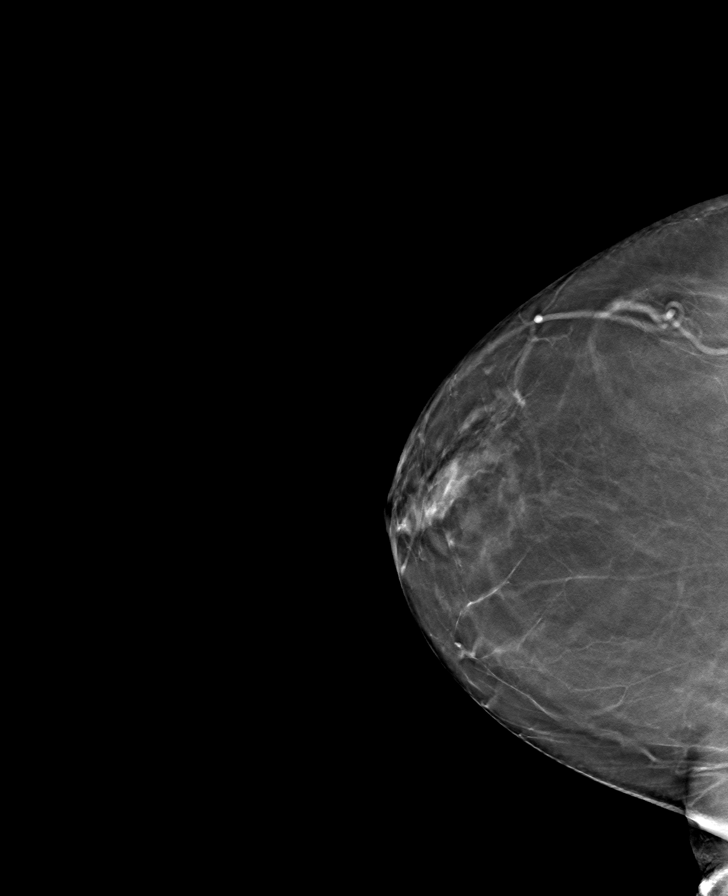

[R MLO tomo · tomo slice 39/77.0]
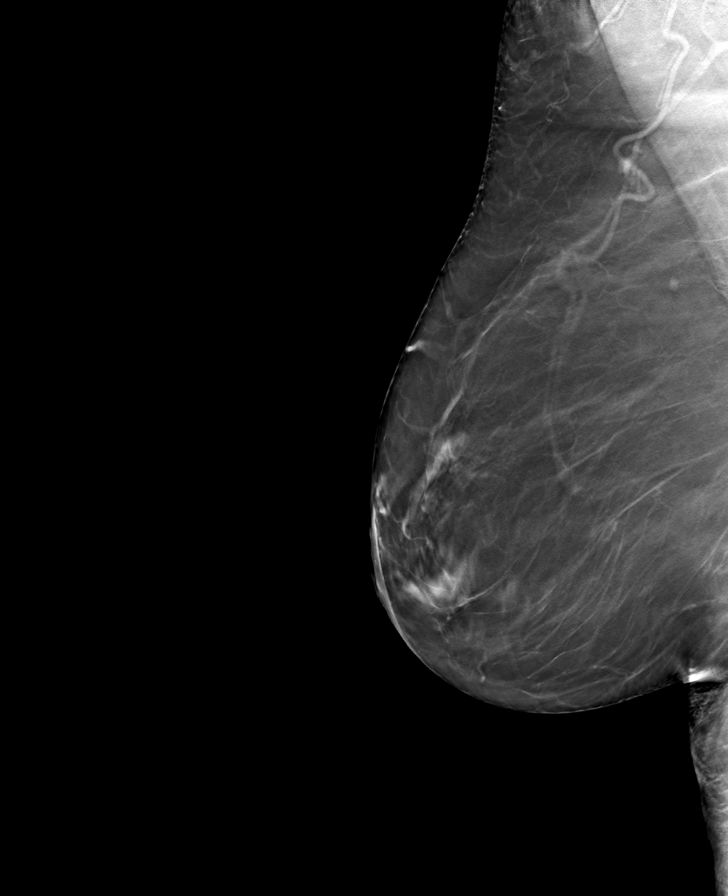

[L MLO tomo · tomo slice 41/82.0]
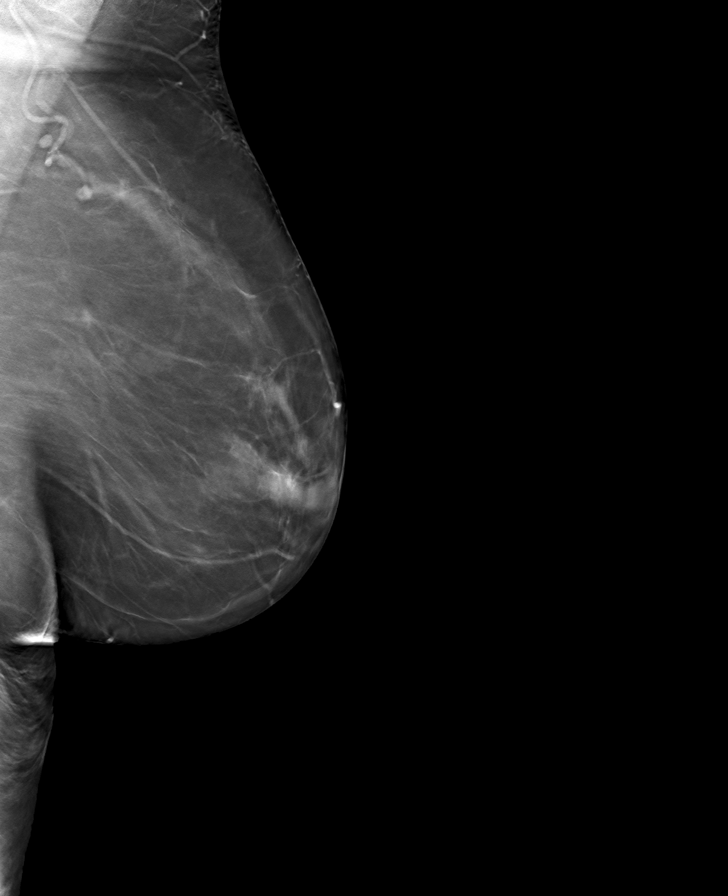

[8 of 24 positions shown; findings below may reference images not displayed]

ACR Breast Density Category b: There are scattered areas of
fibroglandular density.
FINDINGS: There are no findings suspicious for malignancy. Images were
processed with CAD.
IMPRESSION: No mammographic evidence of malignancy. A result letter of this
screening mammogram will be mailed directly to the patient.

RECOMMENDATION:
Screening mammogram in one year. (Code:[id number])

BI-RADS CATEGORY  1: Negative.

## 2021-11-27 ENCOUNTER — Other Ambulatory Visit: Payer: Self-pay | Admitting: Orthopaedic Surgery

## 2021-11-27 ENCOUNTER — Encounter: Payer: PPO | Admitting: Gastroenterology

## 2021-12-13 DIAGNOSIS — H04123 Dry eye syndrome of bilateral lacrimal glands: Secondary | ICD-10-CM | POA: Diagnosis not present

## 2021-12-13 DIAGNOSIS — H18593 Other hereditary corneal dystrophies, bilateral: Secondary | ICD-10-CM | POA: Diagnosis not present

## 2022-01-02 DIAGNOSIS — L905 Scar conditions and fibrosis of skin: Secondary | ICD-10-CM | POA: Diagnosis not present

## 2022-01-02 DIAGNOSIS — L570 Actinic keratosis: Secondary | ICD-10-CM | POA: Diagnosis not present

## 2022-01-02 DIAGNOSIS — M7130 Other bursal cyst, unspecified site: Secondary | ICD-10-CM | POA: Diagnosis not present

## 2022-01-07 ENCOUNTER — Other Ambulatory Visit: Payer: Self-pay | Admitting: Family Medicine

## 2022-01-07 DIAGNOSIS — Z1231 Encounter for screening mammogram for malignant neoplasm of breast: Secondary | ICD-10-CM

## 2022-01-29 ENCOUNTER — Ambulatory Visit
Admission: RE | Admit: 2022-01-29 | Discharge: 2022-01-29 | Disposition: A | Discharge status: Home / Self Care | Payer: PPO | Source: Ambulatory Visit | Attending: Family Medicine | Admitting: Family Medicine

## 2022-01-29 DIAGNOSIS — Z1231 Encounter for screening mammogram for malignant neoplasm of breast: Secondary | ICD-10-CM | POA: Diagnosis not present

## 2022-02-28 DIAGNOSIS — H43812 Vitreous degeneration, left eye: Secondary | ICD-10-CM | POA: Diagnosis not present

## 2022-03-05 DIAGNOSIS — R7303 Prediabetes: Secondary | ICD-10-CM | POA: Diagnosis not present

## 2022-03-05 DIAGNOSIS — Z79899 Other long term (current) drug therapy: Secondary | ICD-10-CM | POA: Diagnosis not present

## 2022-03-05 DIAGNOSIS — K219 Gastro-esophageal reflux disease without esophagitis: Secondary | ICD-10-CM | POA: Diagnosis not present

## 2022-03-05 DIAGNOSIS — E785 Hyperlipidemia, unspecified: Secondary | ICD-10-CM | POA: Diagnosis not present

## 2022-03-05 DIAGNOSIS — E039 Hypothyroidism, unspecified: Secondary | ICD-10-CM | POA: Diagnosis not present

## 2022-03-05 DIAGNOSIS — I5189 Other ill-defined heart diseases: Secondary | ICD-10-CM | POA: Diagnosis not present

## 2022-03-05 DIAGNOSIS — J309 Allergic rhinitis, unspecified: Secondary | ICD-10-CM | POA: Diagnosis not present

## 2022-03-05 DIAGNOSIS — F325 Major depressive disorder, single episode, in full remission: Secondary | ICD-10-CM | POA: Diagnosis not present

## 2022-03-05 DIAGNOSIS — M79644 Pain in right finger(s): Secondary | ICD-10-CM | POA: Diagnosis not present

## 2022-03-05 DIAGNOSIS — F419 Anxiety disorder, unspecified: Secondary | ICD-10-CM | POA: Diagnosis not present

## 2022-03-05 DIAGNOSIS — M810 Age-related osteoporosis without current pathological fracture: Secondary | ICD-10-CM | POA: Diagnosis not present

## 2022-03-05 DIAGNOSIS — Z0000 Encounter for general adult medical examination without abnormal findings: Secondary | ICD-10-CM | POA: Diagnosis not present

## 2022-03-06 ENCOUNTER — Other Ambulatory Visit: Payer: Self-pay | Admitting: Family Medicine

## 2022-03-06 DIAGNOSIS — M810 Age-related osteoporosis without current pathological fracture: Secondary | ICD-10-CM

## 2022-03-21 DIAGNOSIS — H43812 Vitreous degeneration, left eye: Secondary | ICD-10-CM | POA: Diagnosis not present

## 2022-03-26 DIAGNOSIS — I5189 Other ill-defined heart diseases: Secondary | ICD-10-CM | POA: Diagnosis not present

## 2022-07-07 DIAGNOSIS — R2230 Localized swelling, mass and lump, unspecified upper limb: Secondary | ICD-10-CM | POA: Diagnosis not present

## 2022-07-07 DIAGNOSIS — M13841 Other specified arthritis, right hand: Secondary | ICD-10-CM | POA: Diagnosis not present

## 2022-07-07 DIAGNOSIS — M13831 Other specified arthritis, right wrist: Secondary | ICD-10-CM | POA: Diagnosis not present

## 2022-07-23 DIAGNOSIS — H43813 Vitreous degeneration, bilateral: Secondary | ICD-10-CM | POA: Diagnosis not present

## 2022-07-23 DIAGNOSIS — H26493 Other secondary cataract, bilateral: Secondary | ICD-10-CM | POA: Diagnosis not present

## 2022-07-23 DIAGNOSIS — H04123 Dry eye syndrome of bilateral lacrimal glands: Secondary | ICD-10-CM | POA: Diagnosis not present

## 2022-10-27 ENCOUNTER — Ambulatory Visit
Admission: RE | Admit: 2022-10-27 | Discharge: 2022-10-27 | Disposition: A | Discharge status: Home / Self Care | Payer: PPO | Source: Ambulatory Visit | Attending: Family Medicine | Admitting: Family Medicine

## 2022-10-27 DIAGNOSIS — M810 Age-related osteoporosis without current pathological fracture: Secondary | ICD-10-CM

## 2022-10-27 DIAGNOSIS — N951 Menopausal and female climacteric states: Secondary | ICD-10-CM | POA: Diagnosis not present

## 2022-10-28 ENCOUNTER — Emergency Department (HOSPITAL_COMMUNITY): Payer: PPO

## 2022-10-28 ENCOUNTER — Emergency Department (HOSPITAL_COMMUNITY)
Admission: EM | Admit: 2022-10-28 | Discharge: 2022-10-28 | Disposition: A | Discharge status: Home / Self Care | Payer: PPO | Attending: Emergency Medicine | Admitting: Emergency Medicine

## 2022-10-28 ENCOUNTER — Encounter (HOSPITAL_COMMUNITY): Payer: Self-pay

## 2022-10-28 DIAGNOSIS — R079 Chest pain, unspecified: Secondary | ICD-10-CM | POA: Diagnosis not present

## 2022-10-28 DIAGNOSIS — R6884 Jaw pain: Secondary | ICD-10-CM | POA: Insufficient documentation

## 2022-10-28 DIAGNOSIS — R0789 Other chest pain: Secondary | ICD-10-CM | POA: Diagnosis not present

## 2022-10-28 LAB — CBC
HCT: 40.8 % (ref 36.0–46.0)
Hemoglobin: 12.8 g/dL (ref 12.0–15.0)
MCH: 28.2 pg (ref 26.0–34.0)
MCHC: 31.4 g/dL (ref 30.0–36.0)
MCV: 89.9 fL (ref 80.0–100.0)
Platelets: 246 10*3/uL (ref 150–400)
RBC: 4.54 MIL/uL (ref 3.87–5.11)
RDW: 13.4 % (ref 11.5–15.5)
WBC: 5.5 10*3/uL (ref 4.0–10.5)
nRBC: 0 % (ref 0.0–0.2)

## 2022-10-28 LAB — COMPREHENSIVE METABOLIC PANEL
ALT: 28 U/L (ref 0–44)
AST: 25 U/L (ref 15–41)
Albumin: 4.2 g/dL (ref 3.5–5.0)
Alkaline Phosphatase: 40 U/L (ref 38–126)
Anion gap: 9 (ref 5–15)
BUN: 16 mg/dL (ref 8–23)
CO2: 23 mmol/L (ref 22–32)
Calcium: 9.1 mg/dL (ref 8.9–10.3)
Chloride: 104 mmol/L (ref 98–111)
Creatinine, Ser: 0.68 mg/dL (ref 0.44–1.00)
GFR, Estimated: >60 mL/min (ref >60)
Glucose, Bld: 107 mg/dL — ABNORMAL HIGH (ref 70–99)
Potassium: 3.6 mmol/L (ref 3.5–5.1)
Sodium: 136 mmol/L (ref 135–145)
Total Bilirubin: 1 mg/dL (ref 0.3–1.2)
Total Protein: 7.3 g/dL (ref 6.5–8.1)

## 2022-10-28 LAB — CBG MONITORING, ED: Glucose-Capillary: 101 mg/dL — ABNORMAL HIGH (ref 70–99)

## 2022-10-28 LAB — LIPASE, BLOOD: Lipase: 23 U/L (ref 11–51)

## 2022-10-28 LAB — TROPONIN I (HIGH SENSITIVITY)
Troponin I (High Sensitivity): 2 ng/L (ref <18)
Troponin I (High Sensitivity): 3 ng/L (ref <18)

## 2022-10-28 NOTE — Discharge Instructions (Signed)
I am not sure what exactly caused your symptoms.  Your workup here luckily is reassuring that it was not a heart attack.  Please follow-up with your family doctor in the office.

## 2022-10-28 NOTE — ED Provider Notes (Signed)
[place] EMERGENCY DEPARTMENT AT [facility] Provider Note   CSN: [id number] Arrival date & time: 10/28/22  1236     History  Chief Complaint  Patient presents with   Jaw Pain    Vanessa Edwards is a 72 y.o. female.  2 yoF with a chief complaints of severe right jaw and shoulder pain.  This started about 30 minutes prior to arrival.  Has since resolved.  She became quite diaphoretic with it.  She had an episode like this some years ago that also had resolved spontaneously.  Currently is asymptomatic.  She denies any injury to the head of the neck or shoulder.  Denies any issues prior to about 30 minutes ago.  Has been eating and drinking without issue.  Denies cough congestion or fever.  Denies chest pain or abdominal pain.  Patient denies history of MI, denies hypertension hyperlipidemia diabetes or smoking.  Denies family history of MI.  Patient denies history of PE or DVT denies hemoptysis denies unilateral lower extremity edema denies recent surgery immobilization hospitalization estrogen use or history of cancer.          Home Medications Prior to Admission medications   Medication Sig Start Date End Date Taking? Authorizing Provider  citalopram (CELEXA) 10 MG tablet Take 10 mg by mouth daily.    [provider]  diclofenac (VOLTAREN) 75 MG EC tablet TAKE ONE TABLET BY MOUTH TWICE A DAY AS NEEDED 08/05/21   Kathryne Hitch, MD  fluticasone [facility]) 50 MCG/ACT nasal spray Place 1 spray into both nostrils daily at 6 (six) AM. 12/19/20   [provider]  hydrocortisone (ANUSOL-HC) 25 MG suppository Place 1 suppository (25 mg total) rectally at bedtime. 10/11/21   Esterwood, Amy S, PA-C  Omega-3 Fatty Acids (FISH OIL) 1000 MG CAPS Take 1 capsule by mouth daily at 6 (six) AM.    [provider]  omeprazole (PRILOSEC) 20 MG capsule TAKE ONE CAPSULE BY MOUTH DAILY 02/08/18   Napoleon Form, MD  rosuvastatin (CRESTOR) 20 MG tablet  Take 20 mg by mouth at bedtime. 12/27/20   [provider]  SYNTHROID 50 MCG tablet Take 50 mcg by mouth daily. 02/25/21   [provider]      Allergies    Alendronate sodium, Atorvastatin, Ibandronic acid, Sulfacetamide sodium, Sulfamethoxazole-trimethoprim, Amoxicillin, Codeine, Cymbalta [duloxetine hcl], and Nabumetone    Review of Systems   Review of Systems  Physical Exam Updated Vital Signs BP 122/85   Pulse 81   Temp 98.2 F (36.8 C) (Oral)   Resp 13   SpO2 100%  Physical Exam Vitals and nursing note reviewed.  Constitutional:      General: She is not in acute distress.    Appearance: She is well-developed. She is not diaphoretic.  HENT:     Head: Normocephalic and atraumatic.  Eyes:     Pupils: Pupils are equal, round, and reactive to light.  Cardiovascular:     Rate and Rhythm: Normal rate and regular rhythm.     Heart sounds: No murmur heard.    No friction rub. No gallop.  Pulmonary:     Effort: Pulmonary effort is normal.     Breath sounds: No wheezing or rales.  Abdominal:     General: There is no distension.     Palpations: Abdomen is soft.     Tenderness: There is no abdominal tenderness.  Musculoskeletal:        General: No tenderness.  Cervical back: Normal range of motion and neck supple.     Comments: Patient points to the right trapezius but has no obvious pain along the trapezius muscle belly.  No pain at the attachment to the occiput.  She has some very mild if any pain at the TMJ.  No obvious intraoral pathology.  No pain along the anterior chest wall.  Skin:    General: Skin is warm and dry.  Neurological:     Mental Status: She is alert and oriented to person, place, and time.  Psychiatric:        Behavior: Behavior normal.     ED Results / Procedures / Treatments   Labs (all labs ordered are listed, but only abnormal results are displayed) Labs Reviewed  COMPREHENSIVE METABOLIC PANEL - Abnormal; Notable for the  following components:      Result Value   Glucose, Bld 107 (*)    All other components within normal limits  CBG MONITORING, ED - Abnormal; Notable for the following components:   Glucose-Capillary 101 (*)    All other components within normal limits  CBC  LIPASE, BLOOD  TROPONIN I (HIGH SENSITIVITY)  TROPONIN I (HIGH SENSITIVITY)    EKG EKG Interpretation  Date/Time:  Tuesday Oct 28 2022 13:07:35 EDT Ventricular Rate:  85 PR Interval:  142 QRS Duration: 135 QT Interval:  367 QTC Calculation: 437 R Axis:   64 Text Interpretation: Sinus rhythm Right bundle branch block Borderline ST elevation, lateral leads No old tracing to compare Confirmed by Melene Plan [phone]) on 10/28/2022 3:01:20 PM  Radiology DG Chest Port 1 View  Result Date: 10/28/2022 CLINICAL DATA:  Chest pain. EXAM: PORTABLE CHEST 1 VIEW COMPARISON:  None Available. FINDINGS: Clear lungs. Normal heart size and mediastinal contours. No pleural effusion or pneumothorax. Old rib fracture deformities along the left chest wall. Visualized upper abdomen is unremarkable. IMPRESSION: No evidence of acute cardiopulmonary disease. Electronically Signed   By: Orvan Falconer M.D.   On: 10/28/2022 13:46   DG BONE DENSITY (DXA)  Result Date: 10/27/2022 EXAM: DUAL X-RAY ABSORPTIOMETRY (DXA) FOR BONE MINERAL DENSITY IMPRESSION: Referring Physician:  CYNTHIA WHITE Your patient completed a bone mineral density test using GE Lunar iDXA system (analysis version: 16). Technologist:    lmn PATIENT: Name: Vanessa Edwards, Vanessa Edwards Patient ID:  [id number] Birth Date: Jun 07, 1951 Height:     60.5 in. Sex:         Female Measured:   10/27/2022 Weight:     151.6 lbs. Indications: Advanced Age, Caucasian, Celexa, Depression, Estrogen Deficient, Hypothyroid, Omeprazole, Postmenopausal, Synthroid Fractures: None Treatments: Calcium (E943.0), Multivitamin, Vitamin D (E933.5) ASSESSMENT: The BMD measured at AP Spine L2-L4 (L3) is 0.858 g/cm2 with a T-score of -2.9.  This patient is considered osteoporotic according to World Health Organization [facility]) criteria. The quality of the exam is good. L1 and L3 were excluded due to degenerative changes. Site Region Measured Date Measured Age YA BMD Significant CHANGE T-score AP Spine  L2-L4 (L3)  10[age]27/2021    72.0         -2.9    0.858 g/cm2 AP Spine  L2-L4 (L3)  10/24/2020    [age]         -2.5    0.906 g/cm2 DualFemur Total Right 10/27/2022    72.0         -1.5    0.822 g/cm2 DualFemur Total Right 10/24/2020    70.0         -1.3  0.844 g/cm2 DualFemur Total Mean  10/27/2022    72.0         -1.3    0.841 g/cm2 DualFemur Total Mean  10/24/2020    [age]         -1.2    0.854 g/cm2 World Health Organization [facility]) criteria for post-menopausal, Caucasian Women: Normal       T-score at or above -1 SD Osteopenia   T-score between -1 and -2.5 SD Osteoporosis T-score at or below -2.5 SD RECOMMENDATION: 1. All patients should optimize calcium and vitamin D intake. 2. Consider FDA-approved medical therapies in postmenopausal women and men aged 44 years and older, based on the following: a. A hip or vertebral (clinical or morphometric) fracture. b. T-score = -2.5 at the femoral neck or spine after appropriate evaluation to exclude secondary causes. c. Low bone mass (T-score between -1.0 and -2.5 at the femoral neck or spine) and a 10-year probability of a hip fracture = 3% or a 10-year probability of a major osteoporosis-related fracture = 20% based on the [place]-adapted WHO algorithm. d. Clinician judgment and/or patient preferences may indicate treatment for people with 10-year fracture probabilities above or below these levels. FOLLOW-UP: Patients with diagnosis of osteoporosis or at high risk for fracture should have regular bone mineral density tests. Patients eligible for Medicare are allowed routine testing every 2 years. The testing frequency can be increased to one year for patients who have rapidly progressing disease, are receiving or  discontinuing medical therapy to restore bone mass, or have additional risk factors. I have reviewed this study and agree with the findings. [facility] Electronically Signed   By: Frederico Hamman M.D.   On: 10/27/2022 13:44    Procedures Procedures    Medications Ordered in ED Medications - No data to display  ED Course/ Medical Decision Making/ A&P                             Medical Decision Making Amount and/or Complexity of Data Reviewed Labs: ordered. Radiology: ordered.   72 yo F with a chief complaints of right-sided neck pain that went to the jaw.  This lasted for maybe 20 minutes and then resolved.  Started spontaneously while she was at rest riding in the car.  Did have some difficulty breathing with it and was diaphoretic.  Seems unlikely to be a presentation of an MI, will obtain 2 troponins.  Blood work.  Reassess.  2 troponins are negative.  No significant electrolyte abnormalities.  LFTs and lipase are unremarkable.  Chest x-ray independently interpreted by me without focal infiltrate or pneumothorax.  Patient continues to feel well.  Will discharge home.  PCP follow-up.  3:56 PM:  I have discussed the diagnosis/risks/treatment options with the patient and family.  Evaluation and diagnostic testing in the emergency department does not suggest an emergent condition requiring admission or immediate intervention beyond what has been performed at this time.  They will follow up with PCP. We also discussed returning to the ED immediately if new or worsening sx occur. We discussed the sx which are most concerning (e.g., sudden worsening pain, fever, inability to tolerate by mouth) that necessitate immediate return. Medications administered to the patient during their visit and any new prescriptions provided to the patient are listed below.  Medications given during this visit Medications - No data to display   The patient appears reasonably screen and/or  stabilized for discharge and  I doubt any other medical condition or other [facility] requiring further screening, evaluation, or treatment in the ED at this time prior to discharge.          Final Clinical Impression(s) / ED Diagnoses Final diagnoses:  Jaw pain    Rx / DC Orders ED Discharge Orders     None         Melene Plan, DO 10/28/22 1556

## 2022-10-28 NOTE — ED Triage Notes (Signed)
R side jaw and shoulder pain that stared about 30 mins ago, nausea diaphoretic. Riding in car when symptoms started

## 2022-12-10 ENCOUNTER — Other Ambulatory Visit: Payer: Self-pay | Admitting: Physician Assistant

## 2022-12-15 ENCOUNTER — Other Ambulatory Visit: Payer: Self-pay | Admitting: Physician Assistant

## 2023-03-04 ENCOUNTER — Other Ambulatory Visit: Payer: Self-pay | Admitting: Family Medicine

## 2023-03-04 DIAGNOSIS — Z1231 Encounter for screening mammogram for malignant neoplasm of breast: Secondary | ICD-10-CM

## 2023-03-06 DIAGNOSIS — H26493 Other secondary cataract, bilateral: Secondary | ICD-10-CM | POA: Diagnosis not present

## 2023-03-06 DIAGNOSIS — H43813 Vitreous degeneration, bilateral: Secondary | ICD-10-CM | POA: Diagnosis not present

## 2023-03-11 ENCOUNTER — Ambulatory Visit
Admission: RE | Admit: 2023-03-11 | Discharge: 2023-03-11 | Disposition: A | Discharge status: Home / Self Care | Payer: PPO | Source: Ambulatory Visit | Attending: Family Medicine | Admitting: Family Medicine

## 2023-03-11 DIAGNOSIS — Z1231 Encounter for screening mammogram for malignant neoplasm of breast: Secondary | ICD-10-CM | POA: Diagnosis not present

## 2023-03-12 ENCOUNTER — Other Ambulatory Visit (HOSPITAL_COMMUNITY): Payer: Self-pay | Admitting: Family Medicine

## 2023-03-12 DIAGNOSIS — Z23 Encounter for immunization: Secondary | ICD-10-CM | POA: Diagnosis not present

## 2023-03-12 DIAGNOSIS — F419 Anxiety disorder, unspecified: Secondary | ICD-10-CM | POA: Diagnosis not present

## 2023-03-12 DIAGNOSIS — Z0000 Encounter for general adult medical examination without abnormal findings: Secondary | ICD-10-CM | POA: Diagnosis not present

## 2023-03-12 DIAGNOSIS — E785 Hyperlipidemia, unspecified: Secondary | ICD-10-CM

## 2023-03-12 DIAGNOSIS — M170 Bilateral primary osteoarthritis of knee: Secondary | ICD-10-CM | POA: Diagnosis not present

## 2023-03-12 DIAGNOSIS — Z79899 Other long term (current) drug therapy: Secondary | ICD-10-CM | POA: Diagnosis not present

## 2023-03-12 DIAGNOSIS — E039 Hypothyroidism, unspecified: Secondary | ICD-10-CM | POA: Diagnosis not present

## 2023-03-12 DIAGNOSIS — E559 Vitamin D deficiency, unspecified: Secondary | ICD-10-CM | POA: Diagnosis not present

## 2023-03-12 DIAGNOSIS — F325 Major depressive disorder, single episode, in full remission: Secondary | ICD-10-CM | POA: Diagnosis not present

## 2023-03-12 DIAGNOSIS — J3089 Other allergic rhinitis: Secondary | ICD-10-CM | POA: Diagnosis not present

## 2023-03-12 DIAGNOSIS — M810 Age-related osteoporosis without current pathological fracture: Secondary | ICD-10-CM | POA: Diagnosis not present

## 2023-03-12 DIAGNOSIS — R7303 Prediabetes: Secondary | ICD-10-CM | POA: Diagnosis not present

## 2023-03-12 DIAGNOSIS — K219 Gastro-esophageal reflux disease without esophagitis: Secondary | ICD-10-CM | POA: Diagnosis not present

## 2023-03-19 ENCOUNTER — Ambulatory Visit (HOSPITAL_COMMUNITY)
Admission: RE | Admit: 2023-03-19 | Discharge: 2023-03-19 | Disposition: A | Discharge status: Home / Self Care | Payer: PPO | Source: Ambulatory Visit | Attending: Family Medicine | Admitting: Family Medicine

## 2023-03-19 DIAGNOSIS — E785 Hyperlipidemia, unspecified: Secondary | ICD-10-CM | POA: Insufficient documentation

## 2023-04-06 ENCOUNTER — Encounter (INDEPENDENT_AMBULATORY_CARE_PROVIDER_SITE_OTHER): Payer: Self-pay | Admitting: Otolaryngology

## 2023-04-21 ENCOUNTER — Encounter (INDEPENDENT_AMBULATORY_CARE_PROVIDER_SITE_OTHER): Payer: Self-pay

## 2023-04-21 ENCOUNTER — Ambulatory Visit (INDEPENDENT_AMBULATORY_CARE_PROVIDER_SITE_OTHER): Payer: PPO | Admitting: Otolaryngology

## 2023-04-21 VITALS — Ht 61.0 in | Wt 149.0 lb

## 2023-04-21 DIAGNOSIS — J343 Hypertrophy of nasal turbinates: Secondary | ICD-10-CM

## 2023-04-21 DIAGNOSIS — R0981 Nasal congestion: Secondary | ICD-10-CM

## 2023-04-21 DIAGNOSIS — J310 Chronic rhinitis: Secondary | ICD-10-CM

## 2023-04-21 DIAGNOSIS — J342 Deviated nasal septum: Secondary | ICD-10-CM | POA: Diagnosis not present

## 2023-04-22 DIAGNOSIS — J343 Hypertrophy of nasal turbinates: Secondary | ICD-10-CM | POA: Insufficient documentation

## 2023-04-22 DIAGNOSIS — J310 Chronic rhinitis: Secondary | ICD-10-CM | POA: Insufficient documentation

## 2023-04-22 DIAGNOSIS — J342 Deviated nasal septum: Secondary | ICD-10-CM | POA: Insufficient documentation

## 2023-04-22 NOTE — Progress Notes (Signed)
Patient ID: Vanessa Edwards, female   DOB: Jan 07, 1951, 72 y.o.   MRN: [id number]  CC: Chronic nasal obstruction, possible right nasal foreign body  HPI:  Vanessa Edwards is a 72 y.o. female who presents today complaining of chronic nasal obstruction for more than 1 year.  Her obstruction is worse on the right side.  She suspects her right nasal cavity may be obstructed by a foreign body or a nasal sore.  She denies any recent sinus infection.  She has a history of environmental allergies.  She was previously seen by an allergist, and was found to be allergic to elm tree pollens.  She is currently using Zyrtec and Flonase as needed.  Currently she denies any fever or visual change.  She has no previous ENT surgery.  Past Medical History:  Diagnosis Date   Anemia    hx of   Arthritis    generalized   Cataract 2020   bilateral sx   Depression    Esophageal stricture    GERD (gastroesophageal reflux disease)    Hiatal hernia    Hyperlipidemia    on meds   Internal hemorrhoids    Osteoporosis    on meds   Seasonal allergies    Thyroid disease    on meds    Past Surgical History:  Procedure Laterality Date   CATARACT EXTRACTION, BILATERAL Bilateral 2020   COLONOSCOPY  2012   DB-F/V-mov(good)-normal + hems   WISDOM TOOTH EXTRACTION      Family History  Problem Relation Age of Onset   Throat cancer Mother        smoker   Colon cancer Neg Hx    Stomach cancer Neg Hx    Esophageal cancer Neg Hx     Social History:  reports that she has never smoked. She has never used smokeless tobacco. She reports current alcohol use of about 14.0 standard drinks of alcohol per week. She reports that she does not use drugs.  Allergies:  Allergies  Allergen Reactions   Alendronate Sodium     Other reaction(s): joint pain/swallowing difficulty   Atorvastatin     Other reaction(s): lichen planus   Ibandronic Acid Other (See Comments)   Sulfacetamide Sodium Hives    Sulfamethoxazole-Trimethoprim Hives   Amoxicillin Rash   Codeine Anxiety   Cymbalta [Duloxetine Hcl] Palpitations   Nabumetone Rash    Prior to Admission medications   Medication Sig Start Date End Date Taking? Authorizing Provider  ANUCORT-HC 25 MG suppository INSERT 1 SUPPOSITORY RECTALLY EVERY NIGHT AT BEDTIME 12/15/22  Yes Esterwood, Amy S, PA-C  citalopram (CELEXA) 10 MG tablet Take 10 mg by mouth daily.   Yes [provider]  diclofenac (VOLTAREN) 75 MG EC tablet TAKE ONE TABLET BY MOUTH TWICE A DAY AS NEEDED 08/05/21  Yes Kathryne Hitch, MD  fluticasone [facility]) 50 MCG/ACT nasal spray Place 1 spray into both nostrils daily at 6 (six) AM. 12/19/20  Yes [provider]  Omega-3 Fatty Acids (FISH OIL) 1000 MG CAPS Take 1 capsule by mouth daily at 6 (six) AM.   Yes [provider]  omeprazole (PRILOSEC) 20 MG capsule TAKE ONE CAPSULE BY MOUTH DAILY 02/08/18  Yes Nandigam, Kavitha V, MD  rosuvastatin (CRESTOR) 20 MG tablet Take 20 mg by mouth at bedtime. 12/27/20  Yes [provider]  SYNTHROID 50 MCG tablet Take 50 mcg by mouth daily. 02/25/21  Yes [provider]   Height 5\' 1"  (1.549 m), weight 149  lb (67.6 kg). Exam: General: Communicates without difficulty, well nourished, no acute distress. Head: Normocephalic, no evidence injury, no tenderness, facial buttresses intact without stepoff. Face/sinus: No tenderness to palpation and percussion. Facial movement is normal and symmetric. Eyes: PERRL, EOMI. No scleral icterus, conjunctivae clear. Neuro: CN II exam reveals vision grossly intact.  No nystagmus at any point of gaze. Ears: Auricles well formed without lesions.  Ear canals are intact without mass or lesion.  No erythema or edema is appreciated.  The TMs are intact without fluid. Nose: External evaluation reveals normal support and skin without lesions.  Dorsum is intact.  Anterior rhinoscopy reveals congested mucosa over anterior aspect  of inferior turbinates and intact septum.  No purulence noted. Oral:  Oral cavity and oropharynx are intact, symmetric, without erythema or edema.  Mucosa is moist without lesions. Neck: Full range of motion without pain.  There is no significant lymphadenopathy.  No masses palpable.  Thyroid bed within normal limits to palpation.  Parotid glands and submandibular glands equal bilaterally without mass.  Trachea is midline. Neuro:  CN 2-12 grossly intact.    Procedure:  Flexible Nasal Endoscopy: Description: Risks, benefits, and alternatives of flexible endoscopy were explained to the patient.  Specific mention was made of the risk of throat numbness with difficulty swallowing, possible bleeding from the nose and mouth, and pain from the procedure.  The patient gave oral consent to proceed.  The flexible scope was inserted into the right nasal cavity.  Endoscopy of the interior nasal cavity, superior, inferior, and middle meatus was performed. The sphenoid-ethmoid recess was examined. Edematous mucosa was noted.  No polyp, mass, or lesion was appreciated. Nasal septal deviation noted. Olfactory cleft was clear.  Nasopharynx was clear.  Turbinates were hypertrophied but without mass.  The procedure was repeated on the contralateral side with similar findings.  The patient tolerated the procedure well.    Assessment: 1.  Chronic/allergic rhinitis, with nasal mucosal congestion, nasal septal deviation, and bilateral inferior turbinate hypertrophy. 2.  No suspicious lesion, mass, or foreign body is noted on today's nasal endoscopy examination.  Plan: 1.  The physical exam and nasal endoscopy findings are reviewed with the patient. 2.  The patient is reassured that no lesion or foreign body is noted within her right nasal cavity. 3.  Flonase nasal spray 2 sprays each nostril daily.  The importance of consistent daily use is discussed.  She may also use Zyrtec as needed. 4.  The patient is encouraged to call  with any questions or concerns.  Viona Hosking W Chrisopher Pustejovsky 04/22/2023, 8:37 PM

## 2023-05-07 DIAGNOSIS — Z79899 Other long term (current) drug therapy: Secondary | ICD-10-CM | POA: Diagnosis not present

## 2023-05-07 DIAGNOSIS — E785 Hyperlipidemia, unspecified: Secondary | ICD-10-CM | POA: Diagnosis not present

## 2023-06-03 DIAGNOSIS — E039 Hypothyroidism, unspecified: Secondary | ICD-10-CM | POA: Diagnosis not present

## 2023-08-26 DIAGNOSIS — E039 Hypothyroidism, unspecified: Secondary | ICD-10-CM | POA: Diagnosis not present

## 2023-11-27 ENCOUNTER — Ambulatory Visit: Admitting: Podiatry

## 2024-01-22 ENCOUNTER — Ambulatory Visit: Admitting: Podiatry

## 2024-01-22 DIAGNOSIS — M25571 Pain in right ankle and joints of right foot: Secondary | ICD-10-CM | POA: Diagnosis not present

## 2024-01-22 DIAGNOSIS — Q667 Congenital pes cavus, unspecified foot: Secondary | ICD-10-CM

## 2024-01-22 DIAGNOSIS — Q6671 Congenital pes cavus, right foot: Secondary | ICD-10-CM | POA: Diagnosis not present

## 2024-01-22 DIAGNOSIS — Q6672 Congenital pes cavus, left foot: Secondary | ICD-10-CM | POA: Diagnosis not present

## 2024-01-22 NOTE — Progress Notes (Signed)
 Subjective:  Patient ID: Vanessa Edwards, female    DOB: 1951/02/27,  MRN: [id number]  Chief Complaint  Patient presents with   Foot Pain    Pt stated that she has been having some discomfort off and on with her foot     73 y.o. female presents with the above complaint.  Patient presents with right sinus tarsi syndrome painful to touch has progressed gotten worse worse with ambulation is with pressure she states she is noticing the pain occasionally right now she is currently does not any pain but it usually acts up after she has been on her feet for a while she states her pain scale when he gets bad is 7 out of 10 she would like to discuss treatment options for this she currently wears well-fitting shoes.  She also would like to discuss orthotics options   Review of Systems: Negative except as noted in the HPI. Denies N/V/F/Ch.  Past Medical History:  Diagnosis Date   Anemia    hx of   Arthritis    generalized   Cataract 2020   bilateral sx   Depression    Esophageal stricture    GERD (gastroesophageal reflux disease)    Hiatal hernia    Hyperlipidemia    on meds   Internal hemorrhoids    Osteoporosis    on meds   Seasonal allergies    Thyroid  disease    on meds    Current Outpatient Medications:    ANUCORT-HC  25 MG suppository, INSERT 1 SUPPOSITORY RECTALLY EVERY NIGHT AT BEDTIME, Disp: 12 suppository, Rfl: 0   citalopram (CELEXA) 10 MG tablet, Take 10 mg by mouth daily., Disp: , Rfl:    diclofenac  (VOLTAREN ) 75 MG EC tablet, TAKE ONE TABLET BY MOUTH TWICE A DAY AS NEEDED, Disp: 60 tablet, Rfl: 1   fluticasone (FLONASE) 50 MCG/ACT nasal spray, Place 1 spray into both nostrils daily at 6 (six) AM., Disp: , Rfl:    Omega-3 Fatty Acids (FISH OIL) 1000 MG CAPS, Take 1 capsule by mouth daily at 6 (six) AM., Disp: , Rfl:    omeprazole  (PRILOSEC) 20 MG capsule, TAKE ONE CAPSULE BY MOUTH DAILY, Disp: 90 capsule, Rfl: 0   rosuvastatin (CRESTOR) 20 MG tablet, Take 20 mg by mouth  at bedtime., Disp: , Rfl:    SYNTHROID 50 MCG tablet, Take 50 mcg by mouth daily., Disp: , Rfl:   Current Facility-Administered Medications:    0.9 %  sodium chloride  infusion, 500 mL, Intravenous, Once, Nandigam, Kavitha V, MD  Social History   Tobacco Use  Smoking Status Never  Smokeless Tobacco Never    Allergies  Allergen Reactions   Alendronate Sodium     Other reaction(s): joint pain/swallowing difficulty   Atorvastatin     Other reaction(s): lichen planus   Ibandronate Other (See Comments)   Sulfacetamide Sodium Hives   Sulfamethoxazole-Trimethoprim Hives   Amoxicillin Rash   Codeine Anxiety   Cymbalta [Duloxetine Hcl] Palpitations   Nabumetone Rash   Objective:  There were no vitals filed for this visit. There is no height or weight on file to calculate BMI. Constitutional Well developed. Well nourished.  Vascular Dorsalis pedis pulses palpable bilaterally. Posterior tibial pulses palpable bilaterally. Capillary refill normal to all digits.  No cyanosis or clubbing noted. Pedal hair growth normal.  Neurologic Normal speech. Oriented to person, place, and time. Epicritic sensation to light touch grossly present bilaterally.  Dermatologic Nails well groomed and normal in appearance. No open wounds. No  skin lesions.  Orthopedic: Pain on palpation to the sinus tarsi subfibular region.  Pain with range of motion of the subtalar joint no pain at the talonavicular joint or ankle joint.  No pain at the ATFL ligament.  No pain at the peroneal tendon posterior tibial tendon Achilles tendon   Radiographs: None Assessment:   1. Sinus tarsi syndrome of right ankle   2. Pes cavus    Plan:  Patient was evaluated and treated and all questions answered.  Right sinus tarsi syndrome - All questions and concerns were discussed with the patient in extensive detail given the amount of pain that she is having should benefit from a steroid injection help decrease acute  inflammatory component associate with pain.  Patient agrees with plan to proceed with steroid injection -A steroid injection was performed at right sinus tarsi using 1% plain Lidocaine  and 10 mg of Kenalog. This was well tolerated.  -Pes cavus -I explained to patient the etiology of pes cavusand relationship with Planter fasciitis and various treatment options were discussed.  Given patient foot structure in the setting of Planter fasciitis I believe patient will benefit from custom-made orthotics to help control the hindfoot motion support the arch of the foot and take the stress away from plantar fascial.  Patient agrees with the plan like to proceed with orthotics -Patient was casted for orthotics   No follow-ups on file.

## 2024-01-26 ENCOUNTER — Other Ambulatory Visit: Payer: Self-pay | Admitting: Family Medicine

## 2024-01-26 DIAGNOSIS — Z1231 Encounter for screening mammogram for malignant neoplasm of breast: Secondary | ICD-10-CM

## 2024-01-28 DIAGNOSIS — R2231 Localized swelling, mass and lump, right upper limb: Secondary | ICD-10-CM | POA: Diagnosis not present

## 2024-01-28 DIAGNOSIS — M13841 Other specified arthritis, right hand: Secondary | ICD-10-CM | POA: Diagnosis not present

## 2024-01-28 DIAGNOSIS — M13831 Other specified arthritis, right wrist: Secondary | ICD-10-CM | POA: Diagnosis not present

## 2024-02-01 ENCOUNTER — Encounter: Payer: Self-pay | Admitting: *Deleted

## 2024-02-01 ENCOUNTER — Telehealth: Payer: Self-pay | Admitting: Gastroenterology

## 2024-02-01 MED ORDER — HYDROCORTISONE ACETATE 25 MG RE SUPP: 25.0000 mg | Freq: Every day | RECTAL | 1 refills | Status: AC

## 2024-02-01 NOTE — Telephone Encounter (Signed)
 Sent Anucort Suppositorys to [facility] will schedule patient for a follow up with Dr Nandigam, she needs to be seen in the office for any further refills  Scheduled for 04/18/2024 at 9:50 am

## 2024-02-01 NOTE — Telephone Encounter (Signed)
 Ok to send Rx refill, please schedule office f/u visit next available with me. Thanks

## 2024-02-01 NOTE — Telephone Encounter (Signed)
 Patient requesting medication refill for Anucort suppository.   Patient last seen 2 years ago.   Please advise.   Patient states she was told that she would not need to f/u with o/v for refills.

## 2024-02-01 NOTE — Telephone Encounter (Signed)
 Patient was seen for bleeding hemorrhoids in 2023 by Amy Esterwood. She has to follow up with Dr Shila

## 2024-02-01 NOTE — Telephone Encounter (Signed)
 Dr Shila Patient has not been seen for 2 years, she wants refills of  Anucort suppositories that we gave her to take for 10 days 2 years ago. Do you want me to send them in for her ?

## 2024-02-19 ENCOUNTER — Ambulatory Visit

## 2024-03-09 DIAGNOSIS — Z0000 Encounter for general adult medical examination without abnormal findings: Secondary | ICD-10-CM | POA: Diagnosis not present

## 2024-03-09 DIAGNOSIS — E039 Hypothyroidism, unspecified: Secondary | ICD-10-CM | POA: Diagnosis not present

## 2024-03-09 DIAGNOSIS — M810 Age-related osteoporosis without current pathological fracture: Secondary | ICD-10-CM | POA: Diagnosis not present

## 2024-03-09 DIAGNOSIS — F419 Anxiety disorder, unspecified: Secondary | ICD-10-CM | POA: Diagnosis not present

## 2024-03-09 DIAGNOSIS — E785 Hyperlipidemia, unspecified: Secondary | ICD-10-CM | POA: Diagnosis not present

## 2024-03-09 DIAGNOSIS — F325 Major depressive disorder, single episode, in full remission: Secondary | ICD-10-CM | POA: Diagnosis not present

## 2024-03-09 DIAGNOSIS — R7303 Prediabetes: Secondary | ICD-10-CM | POA: Diagnosis not present

## 2024-03-09 DIAGNOSIS — Z23 Encounter for immunization: Secondary | ICD-10-CM | POA: Diagnosis not present

## 2024-03-09 DIAGNOSIS — K219 Gastro-esophageal reflux disease without esophagitis: Secondary | ICD-10-CM | POA: Diagnosis not present

## 2024-03-09 DIAGNOSIS — Z79899 Other long term (current) drug therapy: Secondary | ICD-10-CM | POA: Diagnosis not present

## 2024-03-09 DIAGNOSIS — J358 Other chronic diseases of tonsils and adenoids: Secondary | ICD-10-CM | POA: Diagnosis not present

## 2024-03-09 DIAGNOSIS — E559 Vitamin D deficiency, unspecified: Secondary | ICD-10-CM | POA: Diagnosis not present

## 2024-03-10 ENCOUNTER — Other Ambulatory Visit (HOSPITAL_BASED_OUTPATIENT_CLINIC_OR_DEPARTMENT_OTHER): Payer: Self-pay | Admitting: Family Medicine

## 2024-03-10 DIAGNOSIS — M810 Age-related osteoporosis without current pathological fracture: Secondary | ICD-10-CM

## 2024-03-11 ENCOUNTER — Ambulatory Visit
Admission: RE | Admit: 2024-03-11 | Discharge: 2024-03-11 | Disposition: A | Discharge status: Home / Self Care | Source: Ambulatory Visit | Attending: Family Medicine | Admitting: Family Medicine

## 2024-03-11 DIAGNOSIS — Z1231 Encounter for screening mammogram for malignant neoplasm of breast: Secondary | ICD-10-CM

## 2024-04-18 ENCOUNTER — Ambulatory Visit: Admitting: Gastroenterology

## 2024-07-13 ENCOUNTER — Ambulatory Visit
Admission: RE | Admit: 2024-07-13 | Discharge: 2024-07-13 | Disposition: A | Source: Ambulatory Visit | Attending: Family Medicine | Admitting: Family Medicine

## 2024-07-13 ENCOUNTER — Other Ambulatory Visit: Payer: Self-pay | Admitting: Family Medicine

## 2024-07-13 DIAGNOSIS — S0093XA Contusion of unspecified part of head, initial encounter: Secondary | ICD-10-CM

## 2024-11-02 ENCOUNTER — Other Ambulatory Visit (HOSPITAL_BASED_OUTPATIENT_CLINIC_OR_DEPARTMENT_OTHER)
